# Patient Record
Sex: Male | Born: 1960 | Race: White | Hispanic: No | Marital: Married | Smoking: Never smoker
Health system: Southern US, Community
[De-identification: ages and names within clinical notes are randomized; demographics above are authoritative.]

## PROBLEM LIST (undated history)

## (undated) DIAGNOSIS — E7800 Pure hypercholesterolemia, unspecified: Secondary | ICD-10-CM

## (undated) DIAGNOSIS — Z87442 Personal history of urinary calculi: Secondary | ICD-10-CM

## (undated) DIAGNOSIS — K219 Gastro-esophageal reflux disease without esophagitis: Secondary | ICD-10-CM

## (undated) DIAGNOSIS — E119 Type 2 diabetes mellitus without complications: Secondary | ICD-10-CM

## (undated) HISTORY — PX: CARPAL TUNNEL RELEASE: SHX101

---

## 1998-10-23 HISTORY — PX: ESOPHAGOGASTRODUODENOSCOPY: SHX1529

## 2000-10-18 ENCOUNTER — Encounter: Payer: Self-pay | Admitting: Internal Medicine

## 2000-10-18 ENCOUNTER — Ambulatory Visit (HOSPITAL_COMMUNITY): Admission: RE | Admit: 2000-10-18 | Discharge: 2000-10-18 | Payer: Self-pay | Admitting: Gastroenterology

## 2001-06-03 ENCOUNTER — Encounter: Payer: Self-pay | Admitting: Gastroenterology

## 2001-06-03 ENCOUNTER — Encounter: Admission: RE | Admit: 2001-06-03 | Discharge: 2001-06-03 | Payer: Self-pay | Admitting: Gastroenterology

## 2005-03-29 ENCOUNTER — Ambulatory Visit: Payer: Self-pay | Admitting: Gastroenterology

## 2007-04-03 ENCOUNTER — Ambulatory Visit: Payer: Self-pay | Admitting: Gastroenterology

## 2007-08-24 ENCOUNTER — Inpatient Hospital Stay (HOSPITAL_COMMUNITY): Admission: EM | Admit: 2007-08-24 | Discharge: 2007-08-25 | Payer: Self-pay | Admitting: Emergency Medicine

## 2008-03-12 DIAGNOSIS — K224 Dyskinesia of esophagus: Secondary | ICD-10-CM | POA: Insufficient documentation

## 2008-03-12 DIAGNOSIS — E669 Obesity, unspecified: Secondary | ICD-10-CM | POA: Insufficient documentation

## 2008-03-12 DIAGNOSIS — K219 Gastro-esophageal reflux disease without esophagitis: Secondary | ICD-10-CM | POA: Insufficient documentation

## 2011-03-07 NOTE — H&P (Signed)
Christian Yang, Christian Yang                 ACCOUNT NO.:  [id number]   MEDICAL RECORD NO.:  [id number]          PATIENT TYPE:  EMS   LOCATION:  ED                           FACILITY:  [facility]   PHYSICIAN:  Corinna L. Lendell Caprice, MDDATE OF BIRTH:  [birth year]   DATE OF ADMISSION:  08/24/2007  DATE OF DISCHARGE:                              HISTORY & PHYSICAL   CHIEF COMPLAINT:  Polyuria, polydipsia.   HISTORY OF PRESENT ILLNESS:  Christian Yang is a pleasant 50 year old white  male patient of Dr. Marinda Elk, who was sent to the emergency room  from urgent care.  He had had polyuria, polydipsia, dry mouth for 3-4  days.  He has also been feeling weak and dizzy.  He was noted to have a  very high blood glucose at the urgent care center  and sent to the  emergency room for evaluation.  He has no previous history of diabetes.  He says he has a sore throat and apparently had a strep test done at the  urgent care center which was negative.  He has no other complaints.   PAST MEDICAL HISTORY:  Gastroesophageal reflux disease with esophagitis  and stricture, followed by [facility].   MEDICATIONS:  Prilosec.   ALLERGY:  PENICILLIN.   SOCIAL HISTORY:  The patient is married.  He does not drink, smoke or  use drugs.   FAMILY HISTORY:  His mother has diabetes.  His sister has diabetes.  His  grandmother had diabetes.  His father had coronary artery bypass graft  in his 1s.  His mother has cancer.  He believes it was breast cancer.   PAST SURGICAL HISTORY:  Bilateral carpal tunnel release.   REVIEW OF SYSTEMS:  As above, otherwise negative.   PHYSICAL EXAMINATION:  Temperature is 98.3, blood pressure 147/104,  pulse 117, respiratory rate 20, oxygen saturation 96% on room air.  GENERAL:  The patient is well-nourished, well-developed and in no acute  distress.  HEENT:  Normocephalic, atraumatic.  Pupils equal, round, reactive to  light.  Extraocular movements are intact.  Slightly dry mucous  membranes.  Oropharynx is without exudate.  He had some petechiae over  his oropharynx.  NECK:  Supple.  No lymphadenopathy.  LUNGS:  Clear to auscultation bilaterally without wheezes, rhonchi or  rales.  CARDIOVASCULAR:  Regular rate, rhythm without murmurs, gallops or rubs.  ABDOMEN:  Normal bowel sounds, soft, nontender, nondistended.  GU AND RECTAL:  Deferred.  EXTREMITIES:  No clubbing, cyanosis or edema.  SKIN:  No rash.  PSYCHIATRIC:  Normal affect.  NEUROLOGIC:  Alert and oriented.  Cranial nerves, sensorimotor exam are  intact.   LABORATORY DATA:  CBC unremarkable.  Basic metabolic panel is  significant for a glucose of 418.  His bicarbonate is 28.   ASSESSMENT AND PLAN:  1. New onset diabetes.  The patient has already received 2 bags of      intravenous fluid, and been started on a Glucommander.  I will      continue this.  He will be admitted.  I will start Amaryl and  metformin.  I will also give Lantus and do diabetes teaching.  He      will continue intravenous fluids, monitor electrolytes.  2. Gastroesophageal reflux disease.  Continue proton pump inhibitor.      Corinna L. Lendell Caprice, MD  Electronically Signed     CLS/MEDQ  D:  08/24/2007  T:  08/26/2007  Job:  [id number]   cc:   Molly Maduro L. Foy Guadalajara, M.D.  Fax: [phone]

## 2011-03-07 NOTE — Discharge Summary (Signed)
Christian Yang, Christian Yang                 ACCOUNT NO.:  [id number]   MEDICAL RECORD NO.:  [id number]          PATIENT TYPE:  INP   LOCATION:  1301                         FACILITY:  [facility]   PHYSICIAN:  Corinna L. Lendell Caprice, MDDATE OF BIRTH:  11-Aug-1961   DATE OF ADMISSION:  08/24/2007  DATE OF DISCHARGE:  08/25/2007                               DISCHARGE SUMMARY   DISCHARGE DIAGNOSES:  1. Uncontrolled type 2 diabetes, new diagnosis.  2. Gastroesophageal reflux disease.   DISCHARGE MEDICATIONS:  1. Continue Prilosec.  2. Metformin 500 mg p.o. b.i.d.  3. Amaryl 4 mg a day.   CONDITION:  Stable.   ACTIVITY:  Ad lib.   FOLLOWUP:  1. Dr. Foy Guadalajara next week, with blood glucose measurements.  2. Follow up with outpatient diabetes education classes, which I am      asking the care manager to arrange.  3. He will also follow up with ophthalmologist for diabetic eye      examination.   DIET:  Diabetic.   LABORATORIES:  Hemoglobin A1c 9.7.  CBC unremarkable.  Initial complete  metabolic panel significant for a glucose of 418, otherwise  unremarkable.   HISTORY AND HOSPITAL COURSE:  Christian Yang is a pleasant 50 year old white  male who presented to an Urgent Care Center with polyuria, polydipsia  and dry mouth.  He was found to have blood sugar which was very high.  He was therefore referred to the emergency room.  He was found to have  an elevated blood glucose, he did not have DKA.  He had no previous  history of diabetes, but a strong family history of diabetes.  He was  dehydrated on exam, please see H&P for complete admission details.   He had fairly unremarkable vital signs and otherwise fairly unremarkable  exam.  He was started on IV fluids and insulin drip.  He was  transitioned to Amaryl and metformin, and at the time of discharge his  blood glucoses were ranging in the 100s.  I have asked the nurses to do  diabetic teaching and teach him how to monitor his blood glucose.  I  have  also referred him to outpatient diabetes classes.      Corinna L. Lendell Caprice, MD  Electronically Signed     CLS/MEDQ  D:  08/25/2007  T:  08/26/2007  Job:  [id number]   cc:   Molly Maduro L. Foy Guadalajara, M.D.  Fax: [phone]

## 2011-03-07 NOTE — Assessment & Plan Note (Signed)
[facility]                         GASTROENTEROLOGY OFFICE NOTE   NAME:Uyeno, Boone A                        MRN:          [id number]  DATE:04/03/2007                            DOB:          08/09/1961    Vicent comes in on June 11, says he does have some dysphagia when drinking  something cold or eating certain foods.  He says he does well with the  Prilosec, does not seem he needs anything else.  He has taken Nexium in  the past but [facility] would not continue to pay for this so he  has been using Prilosec.  He said both of these pills give him somewhat  of a bloated feeling.  I told him he should try some different ones and  I would give him samples of these and he could let us know which one  seemed to be most effective and we could prescribe it accordingly and  especially if [facility] will pay it, it would be cheaper than  buying Prilosec over the counter.   PHYSICAL EXAMINATION:  He weighed 276, blood pressure 144/80, pulse 72  and regular.  Neck, heart and extremities are unremarkable.   IMPRESSION:  1. Gastroesophageal reflux disease that responded nicely to proton      pump inhibitor.  2. Mild obesity.   RECOMMENDATIONS:  Is as above and continue taking the medication as  needed.  I also told him to followup with Dr. Leone Payor and that he could  decide which medicines he would be best suited for financially and  symptomatically.     Ulyess Mort, MD  Electronically Signed    SML/MedQ  DD: 04/03/2007  DT: 04/04/2007  Job #: [id number]

## 2011-03-10 NOTE — Procedures (Signed)
[facility]  Patient:    Christian Yang, Christian Yang                          MRN: [id number] Attending:  Hedwig Morton. Juanda Chance, M.D. [facility] CC:         Ulyess Mort, M.D. [facility]   Procedure Report  PROCEDURE:  Upper endoscopy and esophageal dilation.  ENDOSCOPIST:  Hedwig Morton. Juanda Chance, M.D.  INDICATIONS:  This is a 50 year old patient of Dr. Ulyess Mort who was scheduled for upper endoscopy and esophageal dilation today but Dr. Corinda Gubler was unable to do the procedure.  Christian Yang has been complaining of progressive solid food dysphagia, with several episodes of regurgitation of the food, nocturnal reflux and heartburn.  He has never had previous endoscopy and dilatation.  ENDOSCOPE:  Olympus single-channel videoscope.  SEDATION:  Versed 10 mg IV, Demerol 100 mg IV.  FINDINGS:  Olympus single-channel videoscope was passed directly into the posterior pharynx and into the esophagus.  Patient was monitored by pulse oximeter; his oxygen saturation were done to 87%, but responded to increased nasal oxygen from 2 to 5 l/min.  He was very cooperative.  Proximal and mid-esophageal mucosa was unremarkable.  There were several linear erosions in the distal esophagus close to the GE junction; there was also increased fibrous tissue at the GE junction with evidence of mild esophageal stricture. Endoscope traversed into stomach without difficulty.  There were no erosions in midesophagus.  Stomach:  Stomach was insufflated with air, with normal gastric mucosa, gastric antrum and pyloric outlet.  Retroflexion of endoscope revealed normal fundus and cardia.  Duodenum:  Duodenal bulb and descending duodenum were normal.  Endoscope was then brought back into the stomach.  Guidewire was placed into the stomach and Savary dilators passed over the guidewire, using 14-, 15-, 16- and 17-mm Savary dilators.  There was no fluoroscopic guidance.  There was no blood on the dilators.  Patient  tolerated the procedure well.  IMPRESSION 1. Grade 1 reflux esophagitis. 2. Mild distal esophageal stricture, status post dilatation to 51-French.  PLAN 1. Strict antireflux measures including weight loss. 2. Continue Prevacid 30 mg p.o. b.i.d. for four to six weeks and then decrease    to 30 mg a day.  Patient will require acid-suppressing medications on a    chronic basis. 3. Follow up with Dr. Corinda Gubler in six to eight weeks. DD:  10/18/00 TD:  10/18/00 Job: [id number]

## 2011-08-01 LAB — DIFFERENTIAL
Basophils Absolute: 0
Basophils Relative: 0
Eosinophils Absolute: 0
Eosinophils Relative: 0
Lymphocytes Relative: 14
Lymphs Abs: 1.4
Monocytes Absolute: 0.6
Monocytes Relative: 6
Neutro Abs: 7.7
Neutrophils Relative %: 79 — ABNORMAL HIGH

## 2011-08-01 LAB — BASIC METABOLIC PANEL
BUN: 11
BUN: 15
BUN: 19
CO2: 25
CO2: 28
Calcium: 10.1
Calcium: 9.2
Chloride: 100
Chloride: 93 — ABNORMAL LOW
Creatinine, Ser: 0.93
Creatinine, Ser: 1.19
Creatinine, Ser: 1.47
GFR calc Af Amer: >60
GFR calc Af Amer: >60
GFR calc Af Amer: >60
GFR calc non Af Amer: 52 — ABNORMAL LOW
GFR calc non Af Amer: >60
GFR calc non Af Amer: >60
Glucose, Bld: 418 — ABNORMAL HIGH
Glucose, Bld: 678
Potassium: 3.9
Potassium: 4.4
Potassium: 4.6
Sodium: 134 — ABNORMAL LOW
Sodium: 137

## 2011-08-01 LAB — CBC
HCT: 44.4
Hemoglobin: 15.7
MCHC: 35.2
MCV: 83.3
Platelets: 283
RBC: 5.34
RDW: 12.7
WBC: 9.7

## 2011-08-01 LAB — PHOSPHORUS: Phosphorus: 3.2

## 2011-08-01 LAB — HEPATIC FUNCTION PANEL
ALT: 34
Albumin: 3.7
Total Protein: 7.1

## 2011-08-01 LAB — MAGNESIUM: Magnesium: 2.6 — ABNORMAL HIGH

## 2015-10-15 LAB — LIPID PANEL: LDL CALC: 126 mg/dL

## 2015-10-15 LAB — MICROALBUMIN, URINE: MICROALB UR: 9.9

## 2016-02-17 ENCOUNTER — Encounter (HOSPITAL_COMMUNITY): Payer: Self-pay

## 2016-02-17 ENCOUNTER — Other Ambulatory Visit (HOSPITAL_COMMUNITY): Payer: Self-pay | Admitting: Family Medicine

## 2016-02-17 ENCOUNTER — Ambulatory Visit (HOSPITAL_COMMUNITY)
Admission: RE | Admit: 2016-02-17 | Discharge: 2016-02-17 | Disposition: A | Discharge status: Home / Self Care | Payer: Managed Care, Other (non HMO) | Source: Ambulatory Visit | Attending: Family Medicine | Admitting: Family Medicine

## 2016-02-17 ENCOUNTER — Other Ambulatory Visit (HOSPITAL_COMMUNITY): Payer: Self-pay | Admitting: Internal Medicine

## 2016-02-17 DIAGNOSIS — I2609 Other pulmonary embolism with acute cor pulmonale: Secondary | ICD-10-CM

## 2016-02-17 DIAGNOSIS — R938 Abnormal findings on diagnostic imaging of other specified body structures: Secondary | ICD-10-CM | POA: Diagnosis present

## 2016-02-17 DIAGNOSIS — I2782 Chronic pulmonary embolism: Principal | ICD-10-CM

## 2016-02-17 HISTORY — DX: Type 2 diabetes mellitus without complications: E11.9

## 2016-02-17 LAB — POCT I-STAT CREATININE: CREATININE: 1 mg/dL (ref 0.61–1.24)

## 2016-02-17 MED ORDER — IOPAMIDOL (ISOVUE-370) INJECTION 76%
100.0000 mL | Freq: Once | INTRAVENOUS | Status: AC | PRN
  Administered 2016-02-17: 100 mL via INTRAVENOUS

## 2016-03-24 LAB — HEMOGLOBIN A1C: Hemoglobin A1C: 10.3

## 2016-04-07 ENCOUNTER — Encounter: Payer: Self-pay | Admitting: Internal Medicine

## 2016-05-11 ENCOUNTER — Encounter: Payer: Self-pay | Admitting: Endocrinology

## 2016-05-11 ENCOUNTER — Ambulatory Visit (INDEPENDENT_AMBULATORY_CARE_PROVIDER_SITE_OTHER): Payer: Managed Care, Other (non HMO) | Admitting: Endocrinology

## 2016-05-11 VITALS — BP 126/70 | HR 80 | Ht 72.25 in | Wt 177.0 lb

## 2016-05-11 DIAGNOSIS — E7800 Pure hypercholesterolemia, unspecified: Secondary | ICD-10-CM | POA: Diagnosis not present

## 2016-05-11 DIAGNOSIS — E1165 Type 2 diabetes mellitus with hyperglycemia: Secondary | ICD-10-CM

## 2016-05-11 DIAGNOSIS — R634 Abnormal weight loss: Secondary | ICD-10-CM

## 2016-05-11 DIAGNOSIS — Z794 Long term (current) use of insulin: Secondary | ICD-10-CM

## 2016-05-11 DIAGNOSIS — E041 Nontoxic single thyroid nodule: Secondary | ICD-10-CM | POA: Diagnosis not present

## 2016-05-11 NOTE — Patient Instructions (Addendum)
Check blood sugars on waking up  4-5  times a week Also check blood sugars about 2 hours after a meal and do this after different meals by rotation  Recommended blood sugar levels on waking up is 90-130 and about 2 hours after meal is 130-160  Please bring your blood sugar monitor to each visit, thank you  Levemir 20 U 2x daily

## 2016-05-11 NOTE — Progress Notes (Signed)
Patient ID: Christian Yang, male   DOB: [birth year], 55 y.o.   MRN: [id number]           Reason for Appointment: Consultation for Type 2 Diabetes  Referring physician: Lenise Arena   History of Present Illness:          Date of diagnosis of type 2 diabetes mellitus:   2008       Background history:   He apparently had a weight of 285 pounds at diagnosis He was having dry mouth and flulike symptoms at onset and glucose was 672 He was started on metformin is still taking Not clear what other medications he has been taking but he has been on Jardiance for 2 years and Bydureon since 6/17 Records indicated that his A1c has been 10-11+ since at least 2015 Levemir insulin was started 3-4 years ago  Recent history:   INSULIN regimen is:  Levemir 40 units at bedtime       Non-insulin hypoglycemic drugs the patient is taking are: Jardiance 10 mg daily, metformin 1 g twice a day, Bydureon 2 mg weekly  Current management, blood sugar patterns and problems identified:  He has been on the same dose of Levemir for some time  His fasting blood sugars are usually normal to low with some variability.  Blood sugars reviewed from home record  Blood sugars later in the day are consistently high, recently as high as 495    He has been taking Bydureon since last month and has taken it for over 6 weeks without any improvement in his blood sugars  He is continuing to lose weight and has lost 10-12 pounds this year without trying  He tends to get tired but partly from his long work hours  Does not have a formal exercise program  Generally his diet is low in fat and portions         Side effects from medications have been: None  Compliance with the medical regimen:  Hypoglycemia:   occurring about once or twice a week in the mornings, as early as 4 AM  Glucose monitoring:  done 1-3  times a day         Glucometer:    Accu-Chek    Blood Glucose readings by time of day reviewed from home  record   PREMEAL Breakfast Lunch Dinner Bedtime  Overall   Glucose range: 49-177   129-278  178-495    Median:         Self-care: The diet that the patient has been following is: tries to limit High-fat foods .     Typical meal intake: Breakfast is cereal, lunch is a sandwich and fruit.  Dinner is baked chicken, salad and rice.  Snacks are on unit butter and crackers               Dietician visit, most recent: At diagnosis only               Exercise: On his feet at work.  Occasionally walking on his days off   Weight history: Maximum 285  Wt Readings from Last 3 Encounters:  05/11/16 177 lb (80.287 kg)    Glycemic control:   Lab Results  Component Value Date   HGBA1C 10.3 03/24/2016   HGBA1C * 08/24/2007    9.7 (NOTE)   The ADA recommends the following therapeutic goals for glycemic   control related to Hgb A1C measurement:   Goal of Therapy:   < 7.0% Hgb  A1C   Action Suggested:  > 8.0% Hgb A1C   Ref:  Diabetes Care, 22, Suppl. 1, 1999   Lab Results  Component Value Date   MICROALBUR 9.9 10/15/2015   LDLCALC 126 10/15/2015   CREATININE 1.00 02/17/2016   No results found for: MICRALBCREAT       Medication List       This list is accurate as of: 05/11/16  3:49 PM.  Always use your most recent med list.               ACCU-CHEK AVIVA device  by Other route. Use as instructed     aspirin 81 MG tablet  Take 81 mg by mouth daily.     BD PEN NEEDLE NANO U/F 32G X 4 MM Misc  Generic drug:  Insulin Pen Needle  U UTD ONCE A DAY     BYDUREON 2 MG Pen  Generic drug:  Exenatide ER  INJ [place] ONCE A WK  UTD     JARDIANCE 10 MG Tabs tablet  Generic drug:  empagliflozin  TK 1 T PO D     LEVEMIR 100 UNIT/ML injection  Generic drug:  insulin detemir  Inject 40 Units into the skin at bedtime. PEN     metFORMIN 1000 MG tablet  Commonly known as:  GLUCOPHAGE  Take 1,000 mg by mouth 2 (two) times daily with a meal.     omeprazole 40 MG capsule  Commonly known as:   PRILOSEC  Take 40 mg by mouth daily.        Allergies:  Allergies  Allergen Reactions  . Penicillins Rash    Past Medical History  Diagnosis Date  . Diabetes mellitus without complication (HCC)     No past surgical history on file.  Family History  Problem Relation Age of Onset  . Heart disease Mother   . Diabetes Mother   . Diabetes Sister   . Diabetes Maternal Grandmother     Social History:  reports that he has never smoked. He does not have any smokeless tobacco history on file. He reports that he does not drink alcohol. His drug history is not on file.   Review of Systems  Constitutional: Negative for reduced appetite.  HENT: Negative for trouble swallowing.   Eyes: Negative for blurred vision.  Respiratory: Negative for shortness of breath.   Cardiovascular: Negative for chest pain and leg swelling.  Endocrine: Positive for fatigue and polydipsia. Negative for decreased libido and erectile dysfunction.  Genitourinary: Positive for nocturia.       Occasionally one time  Musculoskeletal: Negative for joint pain and back pain.  Skin: Negative for itching.  Neurological: Negative for numbness and tingling.     Lipid history:     Lab Results  Component Value Date   LDLCALC 126 10/15/2015           Hypertension:  Most recent eye exam was   Most recent foot exam:    LABS:  Office Visit on 05/11/2016  Component Date Value Ref Range Status  . Microalb, Ur 10/15/2015 9.9   Final  . LDL Cholesterol 10/15/2015 126   Final  . Hemoglobin A1C 03/24/2016 10.3   Final    Physical Examination:  BP 126/70 mmHg  Pulse 80  Ht 6' 0.25" (1.835 m)  Wt 177 lb (80.287 kg)  BMI 23.84 kg/m2  SpO2 97%  GENERAL:         Patient has generalized obesity.   HEENT:  Eye exam shows normal external appearance. Fundus exam shows no retinopathy. Oral exam shows normal mucosa .  NECK:   There is no lymphadenopathy 2 cm relatively firm nodule felt in the right  medial lobe.  No nodule on the left Carotids are normal to palpation and no bruit heard LUNGS:         Chest is symmetrical and barrel-shaped. Lungs are clear to auscultation.Marland Kitchen   HEART:         Heart sounds:  S1 and S2 are normal. No murmur or click heard., no S3 or S4.   ABDOMEN:   There is no distention present. Liver and spleen are not palpable. No other mass or tenderness present.   NEUROLOGICAL:   Ankle jerks are 1+ bilaterally.    Diabetic Foot Exam - Simple   Simple Foot Form  Diabetic Foot exam was performed with the following findings:  Yes 05/11/2016  2:46 PM  Visual Inspection  No deformities, no ulcerations, no other skin breakdown bilaterally:  Yes  Sensation Testing  Intact to touch and monofilament testing bilaterally:  Yes  Pulse Check  Posterior Tibialis and Dorsalis pulse intact bilaterally:  Yes  Comments             Vibration sense is moderately  reduced in distal first toes. MUSCULOSKELETAL:  There is no swelling or deformity of the peripheral joints. Spine is normal to inspection.   EXTREMITIES:     There is no edema. No skin lesions present.Marland Kitchen SKIN:       No rash or lesions of concern.        ASSESSMENT:  Diabetes type 2, uncontrolled     He has had long-standing diabetes and has had poor control at least for 2 years with A1c consistently over 10% Currently on a regimen of Levemir at night, Bydureon, Jardiance and metformin Has marked post prandial hyperglycemia With Levemir at night he is having normal to low sugars in the mornings getting excessive doses of insulin overnight His lifestyle is fairly good with usually healthy diet although can be more consistent with protein at breakfast and probably moderation of carbohydrates  He may be symptomatic from his hyperglycemia with continued weight loss, some fatigue and increased thirst He is BMI currently is only 24 and is clearly insulin deficient and not benefiting from multiple non-insulin  drugs  Complications of diabetes: None evident  HYPERCHOLESTEROLEMIA: Currently not on treatment.  Has family history of CAD and needs to be on a statin drug. Although he is reportedly intolerant to Lipitor he has not tried any other statins  THYROID nodule: He has a thyroid nodule on the right side and this will need to be evaluated with ultrasound at some point  PLAN for diabetes:    Needs to be on a basal bolus insulin regimen  He was shown the V-go pump as he is somewhat reluctant to take multiple mealtime insulin injections.  Discussed how this is ineffective and glucose control, convenience of doing mealtime boluses with this and overall better controlled with lower insulin doses.  He is interested in this as long as he has no excessive out-of-pocket expense.  We will verify his insurance benefit and schedule him to start this with nurse educator in 10 days  Most likely he can start with 20 unit basal insulin pump with boluses of 6-8 units at meals, higher doses at suppertime  Meanwhile split insulin doses to 20 units twice a day to reduce morning hypoglycemia and daytime  hyperglycemia  May stop Jardiance and Bydureon, continue metformin  Bring blood sugar monitor for download on each visit and check more readings after breakfast and lunch  Consider consultation with dietitian also   Patient Instructions  Check blood sugars on waking up  4-5  times a week Also check blood sugars about 2 hours after a meal and do this after different meals by rotation  Recommended blood sugar levels on waking up is 90-130 and about 2 hours after meal is 130-160  Please bring your blood sugar monitor to each visit, thank you  Levemir 20 U 2x daily   Counseling time on subjects discussed above is over 50% of today's 60 minute visit  Christian Yang 05/11/2016, 3:49 PM   Note: This office note was prepared with Dragon voice recognition system technology. Any transcriptional errors that result  from this process are unintentional.

## 2016-05-17 ENCOUNTER — Ambulatory Visit
Admission: RE | Admit: 2016-05-17 | Discharge: 2016-05-17 | Disposition: A | Discharge status: Home / Self Care | Payer: Managed Care, Other (non HMO) | Source: Ambulatory Visit | Attending: Endocrinology | Admitting: Endocrinology

## 2016-05-17 ENCOUNTER — Encounter: Payer: Managed Care, Other (non HMO) | Attending: Endocrinology | Admitting: Nutrition

## 2016-05-17 DIAGNOSIS — E1165 Type 2 diabetes mellitus with hyperglycemia: Secondary | ICD-10-CM | POA: Insufficient documentation

## 2016-05-17 DIAGNOSIS — E119 Type 2 diabetes mellitus without complications: Secondary | ICD-10-CM

## 2016-05-17 DIAGNOSIS — Z713 Dietary counseling and surveillance: Secondary | ICD-10-CM | POA: Insufficient documentation

## 2016-05-17 DIAGNOSIS — Z794 Long term (current) use of insulin: Secondary | ICD-10-CM | POA: Diagnosis not present

## 2016-05-17 DIAGNOSIS — E041 Nontoxic single thyroid nodule: Secondary | ICD-10-CM

## 2016-05-19 ENCOUNTER — Telehealth: Payer: Self-pay | Admitting: Endocrinology

## 2016-05-19 ENCOUNTER — Telehealth: Payer: Self-pay

## 2016-05-19 NOTE — Telephone Encounter (Signed)
Patient called wanting lab results; no results for blood work in yet; advised of ultrasound results.

## 2016-05-19 NOTE — Telephone Encounter (Signed)
PT looking to get his test results.

## 2016-05-23 DIAGNOSIS — E119 Type 2 diabetes mellitus without complications: Secondary | ICD-10-CM | POA: Insufficient documentation

## 2016-05-23 NOTE — Patient Instructions (Signed)
Fill and apply a new V-go 20every day at the same time Give 3 button presses for smaller meals, and 4 button presses before larger meals. Stop the Levemir insulin Test blood sugars before meals and at bedtime and bring readings to Dr. [name] next week Call if blood sugars drop low or stay over 250.

## 2016-05-23 NOTE — Assessment & Plan Note (Signed)
Pt was instructed on how to fill, apply and use the V-g0 20.  He filled a v-Go with Novolog insulin.  He did not apply it, because he to his Levemir last night at [facility].  He will apply it tonight at [facility].  He was shown how to do this.  He was given a V-go 20 starter kit with directions for use on how to fill, apply and use the V-go.  He was also given an 800 telephone number to call if questions. It was stressed that he will no longer take his Levemir insulin.  Written instructions were given for this. Written instructions were also given for taking 3-4 button presses before each meal. Three button press for smaller meals, and 4 button presses for larger meals.  He reported good understanding of this and re demonstrated how to do the button pressed correctly.   He was also instructed to test his  Blood sugar before meals, and at bedtime.  He was given a sheet to record the blood sugar readings.  He was instructed to call the office if his readings drop low, or go over 250.  He reported good understanding of this and had no final questions.

## 2016-05-25 ENCOUNTER — Encounter: Payer: Self-pay | Admitting: Endocrinology

## 2016-05-25 ENCOUNTER — Ambulatory Visit (INDEPENDENT_AMBULATORY_CARE_PROVIDER_SITE_OTHER): Payer: Managed Care, Other (non HMO) | Admitting: Endocrinology

## 2016-05-25 VITALS — BP 124/70 | HR 76 | Ht 72.0 in | Wt 185.0 lb

## 2016-05-25 DIAGNOSIS — E1165 Type 2 diabetes mellitus with hyperglycemia: Secondary | ICD-10-CM | POA: Diagnosis not present

## 2016-05-25 DIAGNOSIS — Z794 Long term (current) use of insulin: Secondary | ICD-10-CM | POA: Diagnosis not present

## 2016-05-25 DIAGNOSIS — E785 Hyperlipidemia, unspecified: Secondary | ICD-10-CM

## 2016-05-25 MED ORDER — ONETOUCH DELICA LANCETS 33G MISC: 2 refills | Status: AC

## 2016-05-25 MED ORDER — GLUCOSE BLOOD VI STRP: ORAL_STRIP | 12 refills | Status: DC

## 2016-05-25 MED ORDER — V-GO 20 KIT: PACK | 2 refills | Status: DC

## 2016-05-25 MED ORDER — ONETOUCH VERIO FLEX SYSTEM W/DEVICE KIT: 1.0000 | PACK | Freq: Three times a day (TID) | 2 refills | Status: DC

## 2016-05-25 NOTE — Patient Instructions (Signed)
2 click at lunch on weekdays, 3-4 on weekends  3-4 clicks for all meals on weekends

## 2016-05-25 NOTE — Progress Notes (Signed)
Patient ID: Christian Yang, male   DOB: [birth year], 55 y.o.   MRN: [id number]           Reason for Appointment: Follow-up for Type 2 Diabetes  Referring physician: Olen Pel   History of Present Illness:          Date of diagnosis of type 2 diabetes mellitus:   2008       Background history:   He apparently had a weight of 285 pounds at diagnosis He was having dry mouth and flulike symptoms at onset and glucose was 672 He was started on metformin is still taking Not clear what other medications he has been taking but he has been on Jardiance for 2 years and Bydureon since 6/17 Records indicated that his A1c has been 10-11+ since at least 2015 Levemir insulin was started 3-4 years ago  Recent history:   INSULIN regimen is:  V-go pump 20.  Boluses usually 6 units at meals        Non-insulin hypoglycemic drugs the patient is taking are: None, previously was on Jardiance 10 mg daily, metformin 1 g twice a day, Bydureon 2 mg weekly  Current management, blood sugar patterns and problems identified:  He has had marked hyperglycemia with last A1c 10.3 on multiple non-insulin drugs along with Levemir insulin once a day 40 units  He was started on the V-go pump about a week ago using the 20 unit basal  Previously his blood sugars were variable in the morning but usually significantly higher in the evenings  More recently his fasting blood sugars have been relatively better although somewhat inconsistent  Blood sugars have been during the week near normal at lunchtime, relatively low at suppertime and variable after supper  He tends to have higher readings on the weekend since he is not as active  Also occasionally has eye reading after evening meal during the weekday  He has gained some weight back which she had lost before  He subjectively feels better  Generally his diet is low in fat and portions         Side effects from medications have been: None  Compliance with the  medical regimen:  Hypoglycemia:   occurring about once or twice a week in the mornings, as early as 4 AM  Glucose monitoring:  done 1-3  times a day         Glucometer:    Accu-Chek    Blood Glucose readings by time of day reviewed from home record since starting the pump  Mean values apply above for all meters except median for One Touch  PRE-MEAL Fasting Lunch Dinner Bedtime Overall  Glucose range: 112-184  93-186  46-256  77-224    Mean/median:         Self-care: The diet that the patient has been following is: tries to limit High-fat foods .     Typical meal intake: Breakfast is cereal, lunch is a sandwich and fruit.  Dinner is baked chicken, salad and rice.  Snacks are on unit butter and crackers               Dietician visit, most recent: At diagnosis only CDE visit: 7/17               Exercise: On his feet at work.  Occasionally walking on his days off   Weight history: Maximum 285  Wt Readings from Last 3 Encounters:  05/25/16 185 lb (83.9 kg)  05/11/16 177 lb (  80.3 kg)    Glycemic control:   Lab Results  Component Value Date   HGBA1C 10.3 03/24/2016   HGBA1C (H) 08/24/2007    9.7 (NOTE)   The ADA recommends the following therapeutic goals for glycemic   control related to Hgb A1C measurement:   Goal of Therapy:   < 7.0% Hgb A1C   Action Suggested:  > 8.0% Hgb A1C   Ref:  Diabetes Care, 22, Suppl. 1, 1999   Lab Results  Component Value Date   MICROALBUR 9.9 10/15/2015   [place] 126 10/15/2015   CREATININE 1.00 02/17/2016   No results found for: [facility]       Medication List       Accurate as of 05/25/16 11:59 PM. Always use your most recent med list.          ACCU-CHEK AVIVA device by Other route. Use as instructed   ONETOUCH VERIO FLEX SYSTEM w/Device Kit 1 each by Does not apply route 3 (three) times daily.   aspirin 81 MG tablet Take 81 mg by mouth daily.   BD PEN NEEDLE NANO U/F 32G X 4 MM Misc Generic drug:  Insulin Pen Needle U UTD  ONCE A DAY   BYDUREON 2 MG Pen Generic drug:  Exenatide ER INJ [place] ONCE A WK  UTD   glucose blood test strip Commonly known as:  ONETOUCH VERIO Use to check blood sugar 3 times per day.   insulin aspart 100 UNIT/ML injection Commonly known as:  novoLOG Inject into the skin 3 (three) times daily before meals. Per v-go pump.   metFORMIN 1000 MG tablet Commonly known as:  GLUCOPHAGE Take 1,000 mg by mouth 2 (two) times daily with a meal.   omeprazole 40 MG capsule Commonly known as:  PRILOSEC Take 40 mg by mouth daily.   ONETOUCH DELICA LANCETS 51W Misc Use to check blood sugar 3 times per day.   V-GO 20 Kit Use 1 per day.       Allergies:  Allergies  Allergen Reactions  . Penicillins Rash    Past Medical History:  Diagnosis Date  . Diabetes mellitus without complication ([place])     No past surgical history on file.  Family History  Problem Relation Age of Onset  . Heart disease Mother   . Diabetes Mother   . Diabetes Sister   . Diabetes Maternal Grandmother     Social History:  reports that he has never smoked. He does not have any smokeless tobacco history on file. He reports that he does not drink alcohol. His drug history is not on file.   Review of Systems   Lipid history: Not on Rx    Lab Results  Component Value Date   [place] 126 10/15/2015           Hypertension:None  Most recent eye exam was 6/17  Most recent foot exam: 7/17  On his initial exam he was felt to have a thyroid nodule but his ultrasound was normal    LABS:  No visits with results within 1 Week(s) from this visit.  Latest known visit with results is:  Office Visit on 05/11/2016  Component Date Value Ref Range Status  . Microalb, Ur 10/15/2015 9.9   Final  . LDL Cholesterol 10/15/2015 126  mg/dL Final  . Hemoglobin A1C 03/24/2016 10.3   Final    Physical Examination:  BP 124/70 (BP Location: Left Arm, Patient Position: Sitting, Cuff Size: Normal)   Pulse 76  Ht 6' (1.829 m)   Wt 185 lb (83.9 kg)   SpO2 97%   BMI 25.09 kg/m     ASSESSMENT:  Diabetes type 2, uncontrolled    See history of present illness for detailed discussion of current diabetes management, blood sugar patterns and problems identified  He has had long-standing diabetes and has had poor control at least for 2 years with A1c consistently over 10%  Now with starting the V-go pump and using only 20 units basal without any non-insulin hypoglycemic drugs his blood sugars are significantly better and more consistent He has subjectively felt better and has gained back some of this weight loss He did not continue metformin as directed  With current bolus regimen of mostly 6 units per meal is having good control on the weekdays at lunchtime but tendency to low blood sugars before suppertime Blood sugars after supper have been somewhat variable probably based on his diet FASTING blood sugars are near normal about 4 of the mornings he has tested and high on 2 mornings Blood sugars are higher on the weekend when he is less active  HYPERCHOLESTEROLEMIA: Currently not on treatment.  Again discussed possibility of using A statin suggest pravastatin, he has Has family history of CAD    PLAN for diabetes:    Needs to continue the 20 unit basal  Since he is getting low at suppertime on weekdays he can reduce his bolus by 2 units at lunch  May need to use 4 clicks for his meals on weekends and 4 clicks in the evening at dinner if eating a larger meals are more carbohydrate  Discussed adjusting mealtime doses based on food and activity level, and may need to count carbohydrates  He will bring his monitor for download on the next visit May try Novolog for his pump, not clear if this is covered by his insurance  Restart metformin as this will help fasting hyperglycemia more consistently  Consider consultation with dietitian also Recheck lipids on the next visit   Patient  Instructions  2 click at lunch on weekdays, 3-4 on weekends  3-4 clicks for all meals on weekends  Counseling time on subjects discussed above is over 50% of today's 25 minute visit  Alyrica Thurow 05/26/2016, 7:53 AM   Note: This office note was prepared with Dragon voice recognition system technology. Any transcriptional errors that result from this process are unintentional.

## 2016-05-26 ENCOUNTER — Telehealth: Payer: Self-pay | Admitting: Endocrinology

## 2016-05-26 NOTE — Telephone Encounter (Signed)
I contacted the pt's wife and advised we do have a sample the pt could come by and pick up for the v-go 20. She voiced understanding and stated she would come by to pick the rx up.

## 2016-05-26 NOTE — Telephone Encounter (Signed)
Rx submitted along with supplies.

## 2016-05-26 NOTE — Telephone Encounter (Signed)
Pt wife called and said she does not thing the pharmacy will get the VGo in for this weekend and wants to know if PT can have enough until Monday

## 2016-05-26 NOTE — Telephone Encounter (Signed)
Patient need a new meter called in to one touch ultra [facility] - [place], [place] - [address] [phone] (Phone) [phone] (Fax)

## 2016-05-29 ENCOUNTER — Other Ambulatory Visit: Payer: Self-pay

## 2016-05-29 NOTE — Telephone Encounter (Signed)
Patient need medication insulin aspart (NOVOLOG) 100 UNIT/ML injection.sent to pharmacy to go with the vgo pump kit.insulin aspart (NOVOLOG) 100 UNIT/ML injection

## 2016-05-29 NOTE — Telephone Encounter (Signed)
RX request for the novolog was already sent into pharmacy on 05/25/16

## 2016-05-30 MED ORDER — INSULIN ASPART 100 UNIT/ML ~~LOC~~ SOLN: SUBCUTANEOUS | 2 refills | Status: DC

## 2016-05-30 NOTE — Telephone Encounter (Signed)
Rx for novolog resubmitted.

## 2016-05-30 NOTE — Telephone Encounter (Signed)
Pt needs novolog called into [place] [facility] please

## 2016-06-22 ENCOUNTER — Encounter: Payer: Self-pay | Admitting: Endocrinology

## 2016-06-22 ENCOUNTER — Ambulatory Visit (INDEPENDENT_AMBULATORY_CARE_PROVIDER_SITE_OTHER): Payer: Managed Care, Other (non HMO) | Admitting: Endocrinology

## 2016-06-22 VITALS — BP 130/82 | HR 72 | Ht 72.0 in | Wt 191.0 lb

## 2016-06-22 DIAGNOSIS — Z794 Long term (current) use of insulin: Secondary | ICD-10-CM

## 2016-06-22 DIAGNOSIS — E782 Mixed hyperlipidemia: Secondary | ICD-10-CM

## 2016-06-22 DIAGNOSIS — E1165 Type 2 diabetes mellitus with hyperglycemia: Secondary | ICD-10-CM

## 2016-06-22 NOTE — Progress Notes (Signed)
Patient ID: Christian Yang, male   DOB: Mar 14, 1961, 55 y.o.   MRN: [id number]           Reason for Appointment: Follow-up for Type 2 Diabetes  Referring physician: Olen Pel   History of Present Illness:          Date of diagnosis of type 2 diabetes mellitus:   2008       Background history:   He apparently had a weight of 285 pounds at diagnosis He was having dry mouth and flulike symptoms at onset and glucose was 672 He was started on metformin is still taking Not clear what other medications he has been taking but he has been on Jardiance for 2 years and Bydureon since 6/17 Records indicated that his A1c has been 10-11+ since at least 2015 Levemir insulin was started 3-4 years ago  Recent history:   INSULIN regimen is:  V-go pump 20.  Boluses usually 4-4-4 units at meals        Non-insulin hypoglycemic drugs the patient is taking are: metformin 1 g twice a day  He had marked hyperglycemia with last A1c 10.3 on multiple non-insulin drugs along with Levemir insulin once a day 40 units  Current management, blood sugar patterns and problems identified:   He was started on the V-go pump in 7/16  His blood sugars are overall excellent with median 112  However he is still having sporadic hypoglycemia although most of the readings are in the 60s when he has low sugars  He did have more significant hypoglycemia this past weekend both Saturday and Sunday with sugars as low as 33.  This may have been related to his being more active on Saturday but blood sugar was low after lunch on Sunday.  He thinks he did not change his diet.  Overall requiring somewhat less insulin for his meals, now 4 units and previously 6 units.  He has not had any nocturnal hypoglycemia and only one low reading waking up on Sunday morning  Previously fasting readings are relatively higher and they are better with starting metformin  However he is gaining weight.  He is generally somewhat active during his  work during the day  His breakfast is still not continuing much protein.  He will ask sporadic readings in the 200 range, probably from extra snacks.  He takes 2 units bolus for ice cream snack at bedtime  His lowest sugars are before supper and has minimal increase his blood sugars in the afternoon after lunch        Side effects from medications have been: None  Compliance with the medical regimen:  Hypoglycemia:   as above  Glucose monitoring:  done 1-3  times a day         Glucometer:    Accu-Chek    Blood Glucose readings by time of day reviewed from home record    Mean values apply above for all meters except median for One Touch  PRE-MEAL Fasting Lunch Dinner Bedtime Overall  Glucose range: 46-08  57-252  58-237  47-205    Mean/median: 103  116  80  135  112   Self-care: The diet that the patient has been following is: tries to limit High-fat foods .     Typical meal intake: Breakfast is cereal, lunch is a sandwich and fruit. Lunch 1 pm Dinner is baked chicken, salad and rice.  Snacks are on Peanut butter and crackers, having this at 10. 30 a.m. at  work                Dietician visit, most recent: At diagnosis only CDE visit: 7/17               Exercise: On his feet at work.  Occasionally walking on his days off   Weight history: Maximum 285  Wt Readings from Last 3 Encounters:  06/22/16 191 lb (86.6 kg)  05/25/16 185 lb (83.9 kg)  05/11/16 177 lb (80.3 kg)    Glycemic control:   Lab Results  Component Value Date   HGBA1C 10.3 03/24/2016   HGBA1C (H) 08/24/2007    9.7 (NOTE)   The ADA recommends the following therapeutic goals for glycemic   control related to Hgb A1C measurement:   Goal of Therapy:   < 7.0% Hgb A1C   Action Suggested:  > 8.0% Hgb A1C   Ref:  Diabetes Care, 22, Suppl. 1, 1999   Lab Results  Component Value Date   MICROALBUR 9.9 10/15/2015   [place] 126 10/15/2015   CREATININE 1.00 02/17/2016   No results found for:  MICRALBCREAT       Medication List       Accurate as of 06/22/16  9:28 PM. Always use your most recent med list.          ACCU-CHEK AVIVA device by Other route. Use as instructed   ONETOUCH VERIO FLEX SYSTEM w/Device Kit 1 each by Does not apply route 3 (three) times daily.   aspirin 81 MG tablet Take 81 mg by mouth daily.   glucose blood test strip Commonly known as:  ONETOUCH VERIO Use to check blood sugar 3 times per day.   insulin aspart 100 UNIT/ML injection Commonly known as:  novoLOG 20 units per v-go pump.   metFORMIN 1000 MG tablet Commonly known as:  GLUCOPHAGE Take 1,000 mg by mouth 2 (two) times daily with a meal.   omeprazole 40 MG capsule Commonly known as:  PRILOSEC Take 40 mg by mouth daily.   ONETOUCH DELICA LANCETS 33G Misc Use to check blood sugar 3 times per day.   V-GO 20 Kit Use 1 per day.       Allergies:  Allergies  Allergen Reactions  . Penicillins Rash    Past Medical History:  Diagnosis Date  . Diabetes mellitus without complication ([facility])     No past surgical history on file.  Family History  Problem Relation Age of Onset  . Heart disease Mother   . Diabetes Mother   . Diabetes Sister   . Diabetes Maternal Grandmother     Social History:  reports that he has never smoked. He does not have any smokeless tobacco history on file. He reports that he does not drink alcohol. His drug history is not on file.   Review of Systems   Lipid history: Not on Rx    Lab Results  Component Value Date   LDLCALC 126 10/15/2015           Hypertension:None  Most recent eye exam was 6/17  Most recent foot exam: 7/17  On his initial exam he was felt to have a thyroid nodule but his ultrasound was normal    LABS:  No visits with results within 1 Week(s) from this visit.  Latest known visit with results is:  Office Visit on 05/11/2016  Component Date Value Ref Range Status  . Microalb, Ur 10/15/2015 9.9   Final  .  LDL Cholesterol 10/15/2015 126    mg/dL Final  . Hemoglobin A1C 03/24/2016 10.3   Final    Physical Examination:  BP 130/82 (BP Location: Right Arm, Patient Position: Sitting, Cuff Size: Normal)   Pulse 72   Ht 6' (1.829 m)   Wt 191 lb (86.6 kg)   SpO2 98%   BMI 25.90 kg/m     ASSESSMENT:  Diabetes type 2, uncontrolled    See history of present illness for detailed discussion of current diabetes management, blood sugar patterns and problems identified  He has had long-standing diabetes and has had poor control at least for 2 years with A1c consistently over 10%  Now with starting the V-go pump and using only 20 units basal without any non-insulin hypoglycemic drugs his blood sugars are significantly better  As discussed above his blood sugars are not high after meals and with even 4 units of bolus insulin He is tending to have low normal or low sugars in the afternoons and before supper and occasionally at lunchtime Most significant low sugars were this past weekend possibly from increased activity not matched with increased food intake He is comfortable using the pump and likes the convenience of this and better control. However is gaining weight.   PLAN for diabetes:    Need to continue the 20 unit basal  He will reduce his bolus coverage down to 2 units for now and 4 units for larger meals or when he is less active.  May not need bolus for his bedtime snack.  Consistent protein at breakfast.  He will call if he is having excessive hypoglycemia  Continue metformin  He can reduce frequency of monitoring at breakfast time   Discussed blood sugar targets and prevention of hypoglycemia  Recheck lipids on the next visit   Patient Instructions  1 click at lunch and supper  Counseling time on subjects discussed above is over 50% of today's 25 minute visit  KUMAR,AJAY 06/22/2016, 9:28 PM   Note: This office note was prepared with Dragon voice recognition system  technology. Any transcriptional errors that result from this process are unintentional.  

## 2016-06-22 NOTE — Patient Instructions (Signed)
1 click at lunch and supper

## 2016-07-06 ENCOUNTER — Other Ambulatory Visit: Payer: Self-pay | Admitting: Endocrinology

## 2016-07-08 ENCOUNTER — Other Ambulatory Visit: Payer: Self-pay | Admitting: Endocrinology

## 2016-07-10 ENCOUNTER — Other Ambulatory Visit: Payer: Self-pay | Admitting: *Deleted

## 2016-07-10 ENCOUNTER — Telehealth: Payer: Self-pay | Admitting: Endocrinology

## 2016-07-10 MED ORDER — INSULIN ASPART 100 UNIT/ML ~~LOC~~ SOLN: SUBCUTANEOUS | 2 refills | Status: DC

## 2016-07-10 NOTE — Telephone Encounter (Signed)
[facility] called, they have questions regarding the Novolog script, they said that PT is saying he is needing 2 vials to get through the month, they need to verify.  Call back # [phone]

## 2016-07-10 NOTE — Telephone Encounter (Signed)
Rx resent for 2 Vials.

## 2016-07-21 ENCOUNTER — Other Ambulatory Visit (INDEPENDENT_AMBULATORY_CARE_PROVIDER_SITE_OTHER): Payer: Managed Care, Other (non HMO)

## 2016-07-21 DIAGNOSIS — E785 Hyperlipidemia, unspecified: Secondary | ICD-10-CM

## 2016-07-21 DIAGNOSIS — Z794 Long term (current) use of insulin: Secondary | ICD-10-CM

## 2016-07-21 DIAGNOSIS — E1165 Type 2 diabetes mellitus with hyperglycemia: Secondary | ICD-10-CM | POA: Diagnosis not present

## 2016-07-21 DIAGNOSIS — R634 Abnormal weight loss: Secondary | ICD-10-CM

## 2016-07-21 LAB — LIPID PANEL
Cholesterol: 154 mg/dL (ref 0–200)
HDL: 63.7 mg/dL (ref >39.00)
LDL Cholesterol: 81 mg/dL (ref 0–99)
NonHDL: 90
TRIGLYCERIDES: 45 mg/dL (ref 0.0–149.0)
Total CHOL/HDL Ratio: 2
VLDL: 9 mg/dL (ref 0.0–40.0)

## 2016-07-21 LAB — BASIC METABOLIC PANEL
BUN: 13 mg/dL (ref 6–23)
CHLORIDE: 103 meq/L (ref 96–112)
CO2: 32 mEq/L (ref 19–32)
Calcium: 9 mg/dL (ref 8.4–10.5)
Creatinine, Ser: 1 mg/dL (ref 0.40–1.50)
GFR: 83.04 mL/min (ref >60.00)
GLUCOSE: 53 mg/dL — AB (ref 70–99)
POTASSIUM: 3.7 meq/L (ref 3.5–5.1)
SODIUM: 140 meq/L (ref 135–145)

## 2016-07-21 LAB — TSH: TSH: 0.76 u[IU]/mL (ref 0.35–4.50)

## 2016-07-25 ENCOUNTER — Ambulatory Visit (INDEPENDENT_AMBULATORY_CARE_PROVIDER_SITE_OTHER): Payer: Managed Care, Other (non HMO) | Admitting: Endocrinology

## 2016-07-25 ENCOUNTER — Encounter: Payer: Self-pay | Admitting: Endocrinology

## 2016-07-25 VITALS — BP 142/82 | HR 68 | Ht 73.0 in | Wt 197.0 lb

## 2016-07-25 DIAGNOSIS — E1165 Type 2 diabetes mellitus with hyperglycemia: Secondary | ICD-10-CM | POA: Diagnosis not present

## 2016-07-25 DIAGNOSIS — Z794 Long term (current) use of insulin: Secondary | ICD-10-CM | POA: Diagnosis not present

## 2016-07-25 LAB — POCT GLYCOSYLATED HEMOGLOBIN (HGB A1C): HEMOGLOBIN A1C: 7.5

## 2016-07-25 NOTE — Progress Notes (Signed)
Patient ID: Christian Yang, male   DOB: 07/14/1961, 55 y.o.   MRN: [id number]           Reason for Appointment: Follow-up for Type 2 Diabetes  Referring physician: Olen Pel   History of Present Illness:          Date of diagnosis of type 2 diabetes mellitus:   2008       Background history:   He apparently had a weight of 285 pounds at diagnosis He was having dry mouth and flulike symptoms at onset and glucose was 672 He was started on metformin is still taking Not clear what other medications he has been taking but he has been on Jardiance for 2 years and Bydureon since 6/17 Records indicated that his A1c has been 10-11+ since at least 2015 Levemir insulin was started 3-4 years ago  Recent history:   INSULIN regimen is:  V-go pump 20.  Boluses usually 4-4-4 units at meals        Non-insulin hypoglycemic drugs the patient is taking are: metformin 1 g twice a day  He had marked hyperglycemia with last A1c 10.3 on multiple non-insulin drugs along with Levemir insulin once a day 40 units He was started on the V-go pump in 7/16 A1c is now down to 7.5  Current management, blood sugar patterns and problems identified:   Overall blood sugars are relatively higher than before  Although he has some variability in his sugars in the mornings they are overall fairly well controlled without hypoglycemia, only has couple of readings in the 60s in the mornings  Blood sugars are generally stable at lunch but sporadically higher on weekends  He has the lowest readings overall BEFORE suppertime with recently about 3 low readings  Blood sugars are mostly higher after supper with only one or 2 good readings below 200  HYPOGLYCEMIA is significantly less than before  BOLUSES: He was told to reduce his boluses to only 2 units and only use 4 units for larger meals he has continued the same boluses at all meals.  He does not adjust his mealtime boluses based on what he is eating or his planned  activity level; he is generally more active at work and relatively less active on Sundays   He was concerned about his blood sugars being markedly increased on Sunday night and Monday morning with the reading registering high at 8 PM and did not come down to normal to the next day at lunchtime.  Not clear why his sugars were higher and also higher this morning        Side effects from medications have been: None  Compliance with the medical regimen: Fairly good Hypoglycemia:   as above  Glucose monitoring:  done 1-3  times a day         Glucometer:  One Touch     Blood Glucose readings by time of day reviewed from   Mean values apply above for all meters except median for One Touch  PRE-MEAL Fasting Lunch Dinner Bedtime Overall  Glucose range: 65-342 73-315 47-256 143-332   Mean/median: 130 130 113 226 142   Self-care: The diet that the patient has been following is: tries to limit High-fat foods .     Typical meal intake: Breakfast is cereal, lunch is a sandwich and fruit. Lunch 1 pm Dinner is baked chicken, salad and rice.  Snacks are on Peanut butter and crackers, having this at 10. 30 a.m. at work  Dietician visit, most recent: At diagnosis only CDE visit: 7/17               Exercise: On his feet at work.  Occasionally walking on his days off   Weight history: Maximum 285  Wt Readings from Last 3 Encounters:  07/25/16 197 lb (89.4 kg)  06/22/16 191 lb (86.6 kg)  05/25/16 185 lb (83.9 kg)    Glycemic control:   Lab Results  Component Value Date   HGBA1C 7.5 07/25/2016   HGBA1C 10.3 03/24/2016   HGBA1C (H) 08/24/2007    9.7 (NOTE)   The ADA recommends the following therapeutic goals for glycemic   control related to Hgb A1C measurement:   Goal of Therapy:   < 7.0% Hgb A1C   Action Suggested:  > 8.0% Hgb A1C   Ref:  Diabetes Care, 22, Suppl. 1, 1999   Lab Results  Component Value Date   MICROALBUR 9.9 10/15/2015   LDLCALC 81 07/21/2016   CREATININE  1.00 07/21/2016   No results found for: [facility]       Medication List       Accurate as of 07/25/16  8:32 PM. Always use your most recent med list.          ACCU-CHEK AVIVA device by Other route. Use as instructed   ONETOUCH VERIO FLEX SYSTEM w/Device Kit 1 each by Does not apply route 3 (three) times daily.   aspirin 81 MG tablet Take 81 mg by mouth daily.   glucose blood test strip Commonly known as:  ONETOUCH VERIO Use to check blood sugar 3 times per day.   insulin aspart 100 UNIT/ML injection Commonly known as:  NOVOLOG USE MAX 56  UNITS PER DAY WITH V-GO PUMP   metFORMIN 1000 MG tablet Commonly known as:  GLUCOPHAGE Take 1,000 mg by mouth 2 (two) times daily with a meal.   omeprazole 40 MG capsule Commonly known as:  PRILOSEC Take 40 mg by mouth daily.   ONETOUCH DELICA LANCETS 30Q Misc Use to check blood sugar 3 times per day.   V-GO 20 Kit Use 1 per day.       Allergies:  Allergies  Allergen Reactions  . Penicillins Rash    Past Medical History:  Diagnosis Date  . Diabetes mellitus without complication ([place])     No past surgical history on file.  Family History  Problem Relation Age of Onset  . Heart disease Mother   . Diabetes Mother   . Diabetes Sister   . Diabetes Maternal Grandmother     Social History:  reports that he has never smoked. He does not have any smokeless tobacco history on file. He reports that he does not drink alcohol. His drug history is not on file.   Review of Systems   Lipid history: Not on Any statin treatments, initially did have increased cholesterol    Lab Results  Component Value Date   CHOL 154 07/21/2016   HDL 63.70 07/21/2016   LDLCALC 81 07/21/2016   TRIG 45.0 07/21/2016   CHOLHDL 2 07/21/2016           Hypertension: None  Most recent eye exam was 6/17  Most recent foot exam: 7/17  On his initial exam he was felt to have a thyroid nodule but his ultrasound was normal TSH is  normal   LABS:  Office Visit on 07/25/2016  Component Date Value Ref Range Status  . Hemoglobin A1C 07/25/2016 7.5   Final  Lab on 07/21/2016  Component Date Value Ref Range Status  . Sodium 07/21/2016 140  135 - 145 mEq/L Final  . Potassium 07/21/2016 3.7  3.5 - 5.1 mEq/L Final  . Chloride 07/21/2016 103  96 - 112 mEq/L Final  . CO2 07/21/2016 32  19 - 32 mEq/L Final  . Glucose, Bld 07/21/2016 53* 70 - 99 mg/dL Final  . BUN 07/21/2016 13  6 - 23 mg/dL Final  . Creatinine, Ser 07/21/2016 1.00  0.40 - 1.50 mg/dL Final  . Calcium 07/21/2016 9.0  8.4 - 10.5 mg/dL Final  . GFR 07/21/2016 83.04  >60.00 mL/min Final  . TSH 07/21/2016 0.76  0.35 - 4.50 uIU/mL Final  . Cholesterol 07/21/2016 154  0 - 200 mg/dL Final  . Triglycerides 07/21/2016 45.0  0.0 - 149.0 mg/dL Final  . HDL 07/21/2016 63.70  >39.00 mg/dL Final  . VLDL 07/21/2016 9.0  0.0 - 40.0 mg/dL Final  . LDL Cholesterol 07/21/2016 81  0 - 99 mg/dL Final  . Total CHOL/HDL Ratio 07/21/2016 2   Final  . NonHDL 07/21/2016 90.00   Final    Physical Examination:  BP (!) 142/82   Pulse 68   Ht _0  (1.854 m)   Wt 197 lb (89.4 kg)   SpO2 97%   BMI 25.99 kg/m     ASSESSMENT:  Diabetes type 2, uncontrolled    See history of present illness for detailed discussion of current diabetes management, blood sugar patterns and problems identified  He has had long-standing diabetes With previously poor control His blood sugars overall are excellent with A1c now 7.5 However not clear why since his last visit his blood sugars are usually high after evening meal Also gaining some weight back  Most likely needs to reduce his boluses for lunchtime since recently tending to be low normal or low before dinnertime Also probably needs higher bolus coverage at suppertime now His episode of significant hyperglycemia overnight on Sunday was possibly related to his not activating his pump after attaching it  LIPIDS: LDL is below 100  ?   Mild hypertension: We will continue to follow   PLAN for treatment:    Needs to continue the 20 unit basal since fasting readings are usually fairly good  He will reduce his bolus coverage down to 2 units at lunchtime unless planning to be less active or eating a larger meal  Take 6 units coverage for evening meal for usual meals  Adjust mealtime coverage based on amount of food eaten and planned activity, may need more coverage on Sundays especially breakfast if he is eating out  Balanced meals including protein at breakfast  To call if he has any excessive hypoglycemia  He will also need to call the V-go pump hotline if he has persistently very high readings to review the technique of using the pump  New prescription to be given for the V-go pump    Patient Instructions  Try 1 click at lunch on weekdays and 3 clicks at supper for average meal    Counseling time on subjects discussed above is over 50% of today's 25 minute visit  Fredi Geiler 07/25/2016, 8:32 PM   Note: This office note was prepared with Dragon voice recognition system technology. Any transcriptional errors that result from this process are unintentional.

## 2016-07-25 NOTE — Patient Instructions (Signed)
Try 1 click at lunch on weekdays and 3 clicks at supper for average meal

## 2016-07-27 ENCOUNTER — Other Ambulatory Visit: Payer: Self-pay | Admitting: *Deleted

## 2016-07-27 MED ORDER — V-GO 20 KIT: PACK | 2 refills | Status: DC

## 2016-08-01 ENCOUNTER — Other Ambulatory Visit: Payer: Self-pay | Admitting: *Deleted

## 2016-08-01 MED ORDER — V-GO 20 KIT: PACK | 2 refills | Status: DC

## 2016-08-02 ENCOUNTER — Telehealth: Payer: Self-pay | Admitting: Endocrinology

## 2016-08-02 NOTE — Telephone Encounter (Signed)
Patient didn't get your message, he would like a call back.

## 2016-08-02 NOTE — Telephone Encounter (Signed)
Patient wife called she has question about his Vgo pump. Please advise

## 2016-08-02 NOTE — Telephone Encounter (Signed)
Message left informing them that the V-go is a plan exclusion and there is no way to appeal

## 2016-08-03 NOTE — Telephone Encounter (Signed)
I left another message on his voicemail, his insurance declined the approval for his V-go, it is a plan exclusion and there is nothing we can do to get it approved.

## 2016-08-09 ENCOUNTER — Other Ambulatory Visit: Payer: Managed Care, Other (non HMO)

## 2016-08-21 ENCOUNTER — Ambulatory Visit: Payer: Managed Care, Other (non HMO) | Admitting: Endocrinology

## 2016-08-25 ENCOUNTER — Other Ambulatory Visit: Payer: Self-pay | Admitting: Endocrinology

## 2016-08-28 ENCOUNTER — Telehealth: Payer: Self-pay | Admitting: Endocrinology

## 2016-08-28 NOTE — Telephone Encounter (Signed)
Pt's wife just called, stated she was returning Lisa's call regarding Pt's insulin.  Requests call back.

## 2016-08-28 NOTE — Telephone Encounter (Signed)
Patient wife called stated husband do not have any insurance, and want to know if our office have any samples of his insulin. Or can he go to [facility] and buy the Novalog 70/30.  Please advise

## 2016-08-29 NOTE — Telephone Encounter (Signed)
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 Please call the cell number listed. No one is home. Thank you. [phone]

## 2016-08-29 NOTE — Telephone Encounter (Signed)
Spoke with patient and let him know that we do not have any samples at this time

## 2016-11-17 ENCOUNTER — Ambulatory Visit (INDEPENDENT_AMBULATORY_CARE_PROVIDER_SITE_OTHER): Payer: Managed Care, Other (non HMO) | Admitting: Endocrinology

## 2016-11-17 ENCOUNTER — Encounter: Payer: Self-pay | Admitting: Endocrinology

## 2016-11-17 VITALS — BP 124/64 | HR 74 | Ht 73.0 in | Wt 204.0 lb

## 2016-11-17 DIAGNOSIS — Z794 Long term (current) use of insulin: Secondary | ICD-10-CM

## 2016-11-17 DIAGNOSIS — E1165 Type 2 diabetes mellitus with hyperglycemia: Secondary | ICD-10-CM

## 2016-11-17 LAB — POCT GLYCOSYLATED HEMOGLOBIN (HGB A1C): HEMOGLOBIN A1C: 8.2

## 2016-11-17 MED ORDER — INSULIN ASPART 100 UNIT/ML ~~LOC~~ SOLN: SUBCUTANEOUS | 2 refills | Status: DC

## 2016-11-17 NOTE — Progress Notes (Signed)
Patient ID: Christian Yang, male   DOB: [birth year], 56 y.o.   MRN: [id number]           Reason for Appointment: Follow-up for Type 2 Diabetes  Referring physician: Olen Pel   History of Present Illness:          Date of diagnosis of type 2 diabetes mellitus:   2008       Background history:   He apparently had a weight of 285 pounds at diagnosis He was having dry mouth and flulike symptoms at onset and glucose was 672 He was started on metformin is still taking Not clear what other medications he has been taking but he has been on Jardiance for 2 years and Bydureon since 6/17 Records indicated that his A1c has been 10-11+ since at least 2015 Levemir insulin was started 3-4 years ago  Recent history:   INSULIN regimen is:  V-go pump 20.  Boluses usually 4-4-4/6 units at meals        Non-insulin hypoglycemic drugs the patient is taking are: metformin 1 g twice a day  He had marked hyperglycemia with last A1c 10.3 on multiple non-insulin drugs along with Levemir insulin once a day 40 units He was started on the V-go pump in 04/2015  A1c is now higher at 8.2 even though previously was 7.5  Current management, blood sugar patterns and problems identified:   His blood sugars are showing marked fluctuation especially in the mornings  He is now using the REGULAR insulin instead of NovoLog because of cost  Also despite instructions he is taking his boluses) he is eating or afterwards  Also has had a couple of low sugars overnight, mostly around 2 AM especially with eating late  Not clear why some of his FASTING blood sugars are significantly high to the last 4 days he will do blood sugars at suppertime and not consistently high  However he has not any readings AFTER evening meal  Again blood sugars are somewhat variable before lunch but relatively better before supper.  His diet may have been somewhat variable last month  Continues to be fairly active at work  Currently not  adjusting his BOLUSES much based on what he is eating as he is not comfortable counting carbohydrates        Side effects from medications have been: None  Compliance with the medical regimen: Fairly good Hypoglycemia:   as above  Glucose monitoring:  done 1-3  times a day         Glucometer:  One Touch     Blood Glucose readings by time of day reviewed from download  Mean values apply above for all meters except median for One Touch  PRE-MEAL Fasting Lunch Dinner Bedtime Overall  Glucose range: 64-295  78- 243  55-327     Mean/median: 150  134  105   129+/-78     Self-care: The diet that the patient has been following is: tries to limit High-fat foods .     Typical meal intake: Breakfast is cereal, lunch is a sandwich and fruit. Lunch 1 pm Dinner is baked chicken, salad and rice at 7 pm.  Snacks are on Peanut butter and crackers, having this at 10. 30 a.m. at work                Dietician visit, most recent: At diagnosis only CDE visit: 7/17               Exercise:  On his feet at work.  Occasionally walking on his days off   Weight history: Maximum 285  Wt Readings from Last 3 Encounters:  11/17/16 204 lb (92.5 kg)  07/25/16 197 lb (89.4 kg)  06/22/16 191 lb (86.6 kg)    Glycemic control:   Lab Results  Component Value Date   HGBA1C 8.2 11/17/2016   HGBA1C 7.5 07/25/2016   HGBA1C 10.3 03/24/2016   Lab Results  Component Value Date   MICROALBUR 9.9 10/15/2015   LDLCALC 81 07/21/2016   CREATININE 1.00 07/21/2016   No results found for: MICRALBCREAT     Allergies as of 11/17/2016      Reactions   Penicillins Rash      Medication List       Accurate as of 11/17/16 11:59 PM. Always use your most recent med list.          ACCU-CHEK AVIVA device by Other route. Use as instructed   ONETOUCH VERIO FLEX SYSTEM w/Device Kit 1 each by Does not apply route 3 (three) times daily.   aspirin 81 MG tablet Take 81 mg by mouth daily.   glucose blood test  strip Commonly known as:  ONETOUCH VERIO Use to check blood sugar 3 times per day.   insulin aspart 100 UNIT/ML injection Commonly known as:  NOVOLOG USE MAX 56  UNITS PER DAY WITH V-GO PUMP   metFORMIN 1000 MG tablet Commonly known as:  GLUCOPHAGE Take 1,000 mg by mouth 2 (two) times daily with a meal.   omeprazole 40 MG capsule Commonly known as:  PRILOSEC Take 40 mg by mouth daily.   ONETOUCH DELICA LANCETS 78G Misc Use to check blood sugar 3 times per day.   V-GO 20 Kit Use 1 per day       Allergies:  Allergies  Allergen Reactions  . Penicillins Rash    Past Medical History:  Diagnosis Date  . Diabetes mellitus without complication ([place])     No past surgical history on file.  Family History  Problem Relation Age of Onset  . Heart disease Mother   . Diabetes Mother   . Diabetes Sister   . Diabetes Maternal Grandmother     Social History:  reports that he has never smoked. He has never used smokeless tobacco. He reports that he does not drink alcohol. His drug history is not on file.   Review of Systems   Lipid history: Not on Any statin treatments, initially did have increased cholesterol    Lab Results  Component Value Date   CHOL 154 07/21/2016   HDL 63.70 07/21/2016   LDLCALC 81 07/21/2016   TRIG 45.0 07/21/2016   CHOLHDL 2 07/21/2016           Hypertension: Not on any treatment, blood pressure better today  Most recent eye exam was 6/17  Most recent foot exam: 7/17   LABS:  Office Visit on 11/17/2016  Component Date Value Ref Range Status  . Creatinine, Urine 11/17/2016 87.5  Not Estab. mg/dL Final  . Albumin, Urine 11/17/2016 3.3  Not Estab. ug/mL Final  . Microalb/Creat Ratio 11/17/2016 3.8  0.0 - 30.0 mg/g creat Final  . Specific Gravity, UA 11/17/2016      >=1.030* 1.005 - 1.030 Final  . pH, UA 11/17/2016 5.5  5.0 - 7.5 Final  . Color, UA 11/17/2016 Yellow  Yellow Final  . Appearance Ur 11/17/2016 Clear  Clear Final  .  Leukocytes, UA 11/17/2016 Negative  Negative Final  .  Protein, UA 11/17/2016 Negative  Negative/Trace Final  . Glucose, UA 11/17/2016 3+* Negative Final  . Ketones, UA 11/17/2016 Negative  Negative Final  . RBC, UA 11/17/2016 Negative  Negative Final  . Bilirubin, UA 11/17/2016 Negative  Negative Final  . Urobilinogen, Ur 11/17/2016 0.2  0.2 - 1.0 mg/dL Final  . Nitrite, UA 11/17/2016 Negative  Negative Final  . Microscopic Examination 11/17/2016 Comment   Final  . Hemoglobin A1C 11/17/2016 8.2   Final    Physical Examination:  BP 124/64   Pulse 74   Ht '6\' 1"'  (1.854 m)   Wt 204 lb (92.5 kg)   SpO2 97%   BMI 26.91 kg/m     ASSESSMENT:  Diabetes type 2, uncontrolled    See history of present illness for detailed discussion of current diabetes management, blood sugar patterns and problems identified  He has had long-standing diabetes, Requiring basal bolus insulin With the V-go pump he has had overall good control but A1c is increasing now Blood sugars are not as easy to control with his using regular insulin compared to Novolog with this pump Also he is bolusing relatively late at the mealtime or afterwards with his regular insulin Not clear why his fasting readings are so variable  However not able to judge what his evening boluses are doing because of lack of postprandial monitoring despite reminders  PLAN for treatment:    He was given the option of using the freestyle [place] system what he wants to wait  He will try to get the Novolog insulin again  Meanwhile he will try to bolus 15-30 minutes before eating  He will start doing postprandial readings to help adjust his evening dose  He can use 2 clicks at suppertime for his basal [place] 1 click extra for each starch or high-fat item  Consider consultation with dietitian  To call if he has any excessive hypoglycemia  He does however not feel interested in doing basal bolus insulin with multiple injections at this  time  Continue metformin twice a day   Patient Instructions  More sugars at bedtime  Click 92-92 min before nmeals  Counseling time on subjects discussed above is over 50% of today's 25 minute visit  Iolani Twilley 11/19/2016, 2:50 PM   Note: This office note was prepared with Estate agent. Any transcriptional errors that result from this process are unintentional.

## 2016-11-17 NOTE — Patient Instructions (Signed)
More sugars at bedtime  Click 15-30 min before nmeals

## 2016-11-18 LAB — MICROALBUMIN / CREATININE URINE RATIO
Creatinine, Urine: 87.5 mg/dL
MICROALB/CREAT RATIO: 3.8 mg/g{creat} (ref 0.0–30.0)
MICROALBUM., U, RANDOM: 3.3 ug/mL

## 2016-11-18 LAB — URINALYSIS, ROUTINE W REFLEX MICROSCOPIC
Bilirubin, UA: NEGATIVE
Ketones, UA: NEGATIVE
Leukocytes, UA: NEGATIVE
NITRITE UA: NEGATIVE
PH UA: 5.5 (ref 5.0–7.5)
PROTEIN UA: NEGATIVE
RBC, UA: NEGATIVE
Specific Gravity, UA: >=1.03 — AB (ref 1.005–1.030)
Urobilinogen, Ur: 0.2 mg/dL (ref 0.2–1.0)

## 2016-11-27 ENCOUNTER — Other Ambulatory Visit: Payer: Self-pay

## 2016-11-27 ENCOUNTER — Telehealth: Payer: Self-pay | Admitting: Endocrinology

## 2016-11-27 MED ORDER — INSULIN LISPRO 100 UNIT/ML ~~LOC~~ SOLN: SUBCUTANEOUS | 3 refills | Status: DC

## 2016-11-27 NOTE — Telephone Encounter (Signed)
Ordered 11/27/16 

## 2016-11-27 NOTE — Telephone Encounter (Signed)
Pt's wife called in and said that his insurance will no longer cover the Novolog and instead will cover Humulog.  The [facility] [place].

## 2017-02-13 ENCOUNTER — Other Ambulatory Visit: Payer: Self-pay | Admitting: Endocrinology

## 2017-02-23 ENCOUNTER — Encounter: Payer: Self-pay | Admitting: Endocrinology

## 2017-02-23 ENCOUNTER — Ambulatory Visit (INDEPENDENT_AMBULATORY_CARE_PROVIDER_SITE_OTHER): Payer: Managed Care, Other (non HMO) | Admitting: Endocrinology

## 2017-02-23 VITALS — BP 152/94 | HR 69 | Ht 73.0 in | Wt 210.6 lb

## 2017-02-23 DIAGNOSIS — Z794 Long term (current) use of insulin: Secondary | ICD-10-CM

## 2017-02-23 DIAGNOSIS — E1165 Type 2 diabetes mellitus with hyperglycemia: Secondary | ICD-10-CM

## 2017-02-23 LAB — POCT GLYCOSYLATED HEMOGLOBIN (HGB A1C): HEMOGLOBIN A1C: 7.9

## 2017-02-23 MED ORDER — FREESTYLE LIBRE READER DEVI: 1.0000 | 0 refills | Status: DC

## 2017-02-23 MED ORDER — FREESTYLE LIBRE SENSOR SYSTEM MISC: 3 refills | Status: DC

## 2017-02-23 NOTE — Progress Notes (Signed)
Patient ID: Christian Yang, male   DOB: 1961/03/01, 56 y.o.   MRN: [id number]           Reason for Appointment: Follow-up for Type 2 Diabetes  Referring physician: Olen Pel   History of Present Illness:          Date of diagnosis of type 2 diabetes mellitus:   2008       Background history:   He apparently had a weight of 285 pounds at diagnosis He was having dry mouth and flulike symptoms at onset and glucose was 672 He was started on metformin is still taking Not clear what other medications he has been taking but he has been on Jardiance for 2 years and Bydureon since 6/17 Records indicated that his A1c has been 10-11+ since at least 2015 Levemir insulin was started 3-4 years ago  Recent history:   INSULIN regimen is:  V-go pump 20.  Boluses usually 4-4-4/6 units at meals        Non-insulin hypoglycemic drugs the patient is taking are: metformin 1 g twice a day  He had marked hyperglycemia with last A1c 10.3 on multiple non-insulin drugs along with Levemir insulin once a day 40 units He was started on the V-go pump in 04/2015  A1c is now still relatively high at 7.9 usually has been down to 7.5  Current management, blood sugar patterns and problems identified:   His blood sugars are showing marked fluctuation in the mornings again  Despite reminders he does not check his readings after supper and has only a couple of readings which were done either in follow-up of a high reading or once when he felt hypoglycemic  After much discussion he finally admits that he may have snacks or sweets such as ice cream after supper without any boluses  Blood sugars in the evenings do not correlate well with the next mornings blood sugar  Overall blood sugars are not is high at lunch and suppertime although has sporadic high readings at those times also  He continues to gain weight since last year  On his last visit he was told to try switching back to Humalog instead of regular insulin,  this is more affordable now  Continues to be fairly active at work  Currently not adjusting his BOLUSES  based on what he is eating as he is not comfortable counting carbohydrates  He has had only a couple of readings in the 60s, once after supper and no other documented hypoglycemia        Side effects from medications have been: None  Compliance with the medical regimen: Variable  Hypoglycemia:   as above  Glucose monitoring:  done 1-3  times a day         Glucometer:  One Touch     Blood Glucose readings by time of day reviewed from download  Mean values apply above for all meters except median for One Touch  PRE-MEAL Fasting Lunch Dinner Bedtime Overall  Glucose range: 70-285    84-3 26  62, 70    Mean/median: 147  121  124   126+/-68    POST-MEAL PC Breakfast PC Lunch PC Dinner  Glucose range:     Mean/median:        Mean values apply above for all meters except median for One Touch  PRE-MEAL Fasting Lunch Dinner Bedtime Overall  Glucose range: 64-295  78- 243  55-327     Mean/median: 150  134  105  129+/-78     Self-care: The diet that the patient has been following is: tries to limit High-fat foods .     Typical meal intake: Breakfast is cereal, lunch is a sandwich and fruit. Lunch 1 pm Dinner is baked chicken, salad and rice at 7 pm.  Snacks are on Peanut butter and crackers, having this at 10. 30 a.m. at work                Dietician visit, most recent: At diagnosis only CDE visit: 7/17               Exercise: On his feet at work.  Occasionally walking on his days off   Weight history: Maximum 285  Wt Readings from Last 3 Encounters:  02/23/17 210 lb 9.6 oz (95.5 kg)  11/17/16 204 lb (92.5 kg)  07/25/16 197 lb (89.4 kg)    Glycemic control:   Lab Results  Component Value Date   HGBA1C 7.9 02/23/2017   HGBA1C 8.2 11/17/2016   HGBA1C 7.5 07/25/2016   Lab Results  Component Value Date   MICROALBUR 9.9 10/15/2015   LDLCALC 81 07/21/2016    CREATININE 1.00 07/21/2016   No results found for: MICRALBCREAT     Allergies as of 02/23/2017      Reactions   Penicillins Rash      Medication List       Accurate as of 02/23/17 11:59 PM. Always use your most recent med list.          ACCU-CHEK AVIVA device by Other route. Use as instructed   ONETOUCH VERIO FLEX SYSTEM w/Device Kit 1 each by Does not apply route 3 (three) times daily.   aspirin 81 MG tablet Take 81 mg by mouth daily.   FREESTYLE LIBRE READER Devi 1 Device by Does not apply route as directed.   [place] Misc Apply to upper arm and change sensor every 10 days   glucose blood test strip Commonly known as:  ONETOUCH VERIO Use to check blood sugar 3 times per day.   insulin lispro 100 UNIT/ML injection Commonly known as:  HUMALOG USE MAX 56 UNITS PER DAY WITH V-GO PUMP   metFORMIN 1000 MG tablet Commonly known as:  GLUCOPHAGE Take 1,000 mg by mouth 2 (two) times daily with a meal.   omeprazole 40 MG capsule Commonly known as:  PRILOSEC Take 40 mg by mouth daily.   ONETOUCH DELICA LANCETS 29N Misc Use to check blood sugar 3 times per day.   V-GO 20 Kit USE ONE PER DAY AS DIRECTED       Allergies:  Allergies  Allergen Reactions  . Penicillins Rash    Past Medical History:  Diagnosis Date  . Diabetes mellitus without complication ([place])     No past surgical history on file.  Family History  Problem Relation Age of Onset  . Heart disease Mother   . Diabetes Mother   . Diabetes Sister   . Diabetes Maternal Grandmother     Social History:  reports that he has never smoked. He has never used smokeless tobacco. He reports that he does not drink alcohol. His drug history is not on file.   Review of Systems   Lipid history: Not on Any statin treatments, initially did have increased cholesterol    Lab Results  Component Value Date   CHOL 154 07/21/2016   HDL 63.70 07/21/2016   LDLCALC 81 07/21/2016    TRIG 45.0 07/21/2016  CHOLHDL 2 07/21/2016           Hypertension: Not on any treatment, blood pressure better today  Most recent eye exam was 6/17  Most recent foot exam: 7/17   LABS:  Office Visit on 02/23/2017  Component Date Value Ref Range Status  . Hemoglobin A1C 02/23/2017 7.9   Final    Physical Examination:  BP (!) 152/94   Pulse 69   Ht '6\' 1"'  (1.854 m)   Wt 210 lb 9.6 oz (95.5 kg)   SpO2 97%   BMI 27.79 kg/m     ASSESSMENT:  Diabetes type 2, uncontrolled    See history of present illness for detailed discussion of current diabetes management, blood sugar patterns and problems identified  He has had long-standing diabetes, Requiring basal bolus insulin With the V-go pump he has had overall good control but A1c is Is consistently above target Not clear why his fasting readings are so variable But may be related to his snacking at night or variable fat intake in the evening May be having high readings after supper are his evening snacks as his sugars are not consistently high in the morning and median is only 147  However not able to judge what his evening boluses are doing because of lack of postprandial monitoring despite reminders  PLAN for treatment:    He was given the option of using the freestyle [place] system And discussed in detail how this would be helpful since he is not monitoring after supper and after other meals also  This will enable [place] to change his treatment, help him adjust boluses based on his type of meals and help reduce his A1c long-term  Although he may be a good candidate for [employer] this may cause potentially low sugars waking up which have been as low as 70  He will start bolusing 2-4 units for large snacks or sweets after supper  Again reminded him to change him consistently at the same time   Patient Instructions  Check for sugars at bedtime daily  Click for snacks 1-2 x  Counseling time on subjects discussed above is  over 50% of today's 25 minute visit  Christian Yang 02/25/2017, 9:31 PM   Note: This office note was prepared with Dragon voice recognition system technology. Any transcriptional errors that result from this process are unintentional.

## 2017-02-23 NOTE — Patient Instructions (Addendum)
Check for sugars at bedtime daily  Click for snacks 1-2 x

## 2017-04-04 ENCOUNTER — Other Ambulatory Visit: Payer: Self-pay | Admitting: Endocrinology

## 2017-04-21 LAB — HM DIABETES EYE EXAM

## 2017-05-24 ENCOUNTER — Encounter: Payer: Self-pay | Admitting: Endocrinology

## 2017-05-24 ENCOUNTER — Other Ambulatory Visit: Payer: Self-pay

## 2017-05-24 ENCOUNTER — Ambulatory Visit (INDEPENDENT_AMBULATORY_CARE_PROVIDER_SITE_OTHER): Payer: 59 | Admitting: Endocrinology

## 2017-05-24 VITALS — BP 122/76 | HR 77 | Ht 73.0 in | Wt 210.0 lb

## 2017-05-24 DIAGNOSIS — Z794 Long term (current) use of insulin: Secondary | ICD-10-CM | POA: Diagnosis not present

## 2017-05-24 DIAGNOSIS — E7800 Pure hypercholesterolemia, unspecified: Secondary | ICD-10-CM

## 2017-05-24 DIAGNOSIS — E1165 Type 2 diabetes mellitus with hyperglycemia: Secondary | ICD-10-CM

## 2017-05-24 LAB — URINALYSIS, ROUTINE W REFLEX MICROSCOPIC
Bilirubin Urine: NEGATIVE
HGB URINE DIPSTICK: NEGATIVE
KETONES UR: NEGATIVE
Leukocytes, UA: NEGATIVE
NITRITE: NEGATIVE
PH: 6 (ref 5.0–8.0)
Specific Gravity, Urine: >=1.03 — AB (ref 1.000–1.030)
Urine Glucose: NEGATIVE
Urobilinogen, UA: 0.2 (ref 0.0–1.0)

## 2017-05-24 LAB — POCT GLYCOSYLATED HEMOGLOBIN (HGB A1C): Hemoglobin A1C: 7.5

## 2017-05-24 LAB — BASIC METABOLIC PANEL
BUN: 17 mg/dL (ref 6–23)
CHLORIDE: 105 meq/L (ref 96–112)
CO2: 29 mEq/L (ref 19–32)
CREATININE: 1.22 mg/dL (ref 0.40–1.50)
Calcium: 9.6 mg/dL (ref 8.4–10.5)
GFR: 65.8 mL/min (ref >60.00)
GLUCOSE: 169 mg/dL — AB (ref 70–99)
Potassium: 4.4 mEq/L (ref 3.5–5.1)
Sodium: 139 mEq/L (ref 135–145)

## 2017-05-24 LAB — LIPID PANEL
CHOL/HDL RATIO: 4
Cholesterol: 198 mg/dL (ref 0–200)
HDL: 53.2 mg/dL (ref >39.00)
LDL Cholesterol: 129 mg/dL — ABNORMAL HIGH (ref 0–99)
NONHDL: 145.21
TRIGLYCERIDES: 81 mg/dL (ref 0.0–149.0)
VLDL: 16.2 mg/dL (ref 0.0–40.0)

## 2017-05-24 LAB — MICROALBUMIN / CREATININE URINE RATIO
Creatinine,U: 227.9 mg/dL
MICROALB UR: 1.5 mg/dL (ref 0.0–1.9)
Microalb Creat Ratio: 0.7 mg/g (ref 0.0–30.0)

## 2017-05-24 MED ORDER — METFORMIN HCL 1000 MG PO TABS: 1000.0000 mg | ORAL_TABLET | Freq: Two times a day (BID) | ORAL | 5 refills | Status: DC

## 2017-05-24 MED ORDER — INSULIN LISPRO 100 UNIT/ML ~~LOC~~ SOLN: SUBCUTANEOUS | 3 refills | Status: DC

## 2017-05-24 NOTE — Patient Instructions (Addendum)
Must check sugars after supper

## 2017-05-24 NOTE — Progress Notes (Signed)
Patient ID: Christian Yang, male   DOB: 04-Sep-1961, 56 y.o.   MRN: [id number]           Reason for Appointment: Follow-up for Type 2 Diabetes  Referring physician: Olen Pel   History of Present Illness:          Date of diagnosis of type 2 diabetes mellitus:   2008       Background history:   He apparently had a weight of 285 pounds at diagnosis He was having dry mouth and flulike symptoms at onset and glucose was 672 He was started on metformin is still taking Not clear what other medications he has been taking but he has been on Jardiance for 2 years and Bydureon since 6/17 Records indicated that his A1c has been 10-11+ since at least 2015 Levemir insulin was started 3-4 years ago  Recent history:   INSULIN regimen is:  V-go pump 20.  Boluses usually 4-4-4/6 units at meals        Non-insulin hypoglycemic drugs the patient is taking are: metformin 1 g twice a day  He had marked hyperglycemia with last A1c 10.3 on multiple non-insulin drugs along with Levemir insulin once a day 40 units He was started on the V-go pump in 04/2015  A1c is now slightly better at 7.5, last reading was 7.9  Current management, blood sugar patterns and problems identified:   He was advised to start the [place] sensor on the last visit but he still has not started doing this  He cannot explain why his blood sugars in the mornings are fluctuating markedly as before  This is despite is not having any excessive snacks or high-fat meals in the evening  Despite reminders he does not check his readings much after meals and only sporadically before and after supper; most of his blood sugars are fasting and lunchtime  FASTING blood sugars appear to be overall on an average higher but may be as low as 78  HYPOGLYCEMIA: He has had 2 episodes of low blood sugars between 9-11 PM about 3-4 weeks ago, once after eating spaghetti without much protein but he does not know why his sugars were low otherwise  No  overnight hypoglycemia  He is still requiring very little bolus insulin but will take a little more insulin if eating a larger meal in the evening  Lunchtime blood sugars are mostly near her target  Side effects from diabetes medications have been: None  Hypoglycemia:   as above  Glucose monitoring:  done 2  times a day         Glucometer:  One Touch     Blood Glucose readings by time of day reviewed from download  Mean values apply above for all meters except median for One Touch  PRE-MEAL Fasting Lunch Dinner Bedtime Overall  Glucose range:  78-247  88-1 79  69-214     Mean/median: 160  117  83   135+/-48    POST-MEAL PC Breakfast PC Lunch PC Dinner  Glucose range:   39-1 72   Mean/median:        Self-care: The diet that the patient has been following is: tries to limit High-fat foods .     Typical meal intake: Breakfast is cereal, lunch is a sandwich and fruit. Lunch 1 pm Dinner is baked chicken, salad and rice at 7 pm .   Snacks are on Peanut butter and crackers, having this at 10. 30 a.m. at work  Dietician visit, most recent: At diagnosis only CDE visit: 7/17               Exercise: On his feet at work.    Weight history: Maximum 285  Wt Readings from Last 3 Encounters:  05/24/17 210 lb (95.3 kg)  02/23/17 210 lb 9.6 oz (95.5 kg)  11/17/16 204 lb (92.5 kg)    Glycemic control:   Lab Results  Component Value Date   HGBA1C 7.5 05/24/2017   HGBA1C 7.9 02/23/2017   HGBA1C 8.2 11/17/2016   Lab Results  Component Value Date   MICROALBUR 1.5 05/24/2017   LDLCALC 129 (H) 05/24/2017   CREATININE 1.22 05/24/2017   Lab Results  Component Value Date   MICRALBCREAT 0.7 05/24/2017       Allergies as of 05/24/2017      Reactions   Penicillins Rash      Medication List       Accurate as of 05/24/17 11:59 PM. Always use your most recent med list.          ACCU-CHEK AVIVA device by Other route. Use as instructed   ONETOUCH VERIO FLEX  SYSTEM w/Device Kit 1 each by Does not apply route 3 (three) times daily.   aspirin 81 MG tablet Take 81 mg by mouth daily.   FREESTYLE LIBRE READER Devi 1 Device by Does not apply route as directed.   [place] Misc Apply to upper arm and change sensor every 10 days   glucose blood test strip Commonly known as:  ONETOUCH VERIO Use to check blood sugar 3 times per day.   insulin lispro 100 UNIT/ML injection Commonly known as:  HUMALOG USE MAX 56 UNITS PER DAY WITH V-GO PUMP   metFORMIN 1000 MG tablet Commonly known as:  GLUCOPHAGE Take 1 tablet (1,000 mg total) by mouth 2 (two) times daily with a meal.   omeprazole 40 MG capsule Commonly known as:  PRILOSEC Take 40 mg by mouth daily.   ONETOUCH DELICA LANCETS 24M Misc Use to check blood sugar 3 times per day.   V-GO 20 Kit USE ONE PER DAY AS DIRECTED       Allergies:  Allergies  Allergen Reactions  . Penicillins Rash    Past Medical History:  Diagnosis Date  . Diabetes mellitus without complication ([place])     No past surgical history on file.  Family History  Problem Relation Age of Onset  . Heart disease Mother   . Diabetes Mother   . Diabetes Sister   . Diabetes Maternal Grandmother     Social History:  reports that he has never smoked. He has never used smokeless tobacco. He reports that he does not drink alcohol. His drug history is not on file.   Review of Systems   Lipid history: Not on Any statin treatments, Previously did have increased cholesterol    Lab Results  Component Value Date   CHOL 198 05/24/2017   HDL 53.20 05/24/2017   LDLCALC 129 (H) 05/24/2017   TRIG 81.0 05/24/2017   CHOLHDL 4 05/24/2017           Hypertension: Not on any treatment, blood pressure ok today  Most recent eye exam was 6/18  Most recent foot exam: 7/17   LABS:  Office Visit on 05/24/2017  Component Date Value Ref Range Status  . Hemoglobin A1C 05/24/2017 7.5   Final  . Sodium  05/24/2017 139  135 - 145 mEq/L Final  . Potassium  05/24/2017 4.4  3.5 - 5.1 mEq/L Final  . Chloride 05/24/2017 105  96 - 112 mEq/L Final  . CO2 05/24/2017 29  19 - 32 mEq/L Final  . Glucose, Bld 05/24/2017 169* 70 - 99 mg/dL Final  . BUN 05/24/2017 17  6 - 23 mg/dL Final  . Creatinine, Ser 05/24/2017 1.22  0.40 - 1.50 mg/dL Final  . Calcium 05/24/2017 9.6  8.4 - 10.5 mg/dL Final  . GFR 05/24/2017 65.80  >60.00 mL/min Final  . Microalb, Ur 05/24/2017 1.5  0.0 - 1.9 mg/dL Final  . Creatinine,U 05/24/2017 227.9  mg/dL Final  . Microalb Creat Ratio 05/24/2017 0.7  0.0 - 30.0 mg/g Final  . Color, Urine 05/24/2017 YELLOW  Yellow;Lt. Yellow Final  . APPearance 05/24/2017 CLEAR  Clear Final  . Specific Gravity, Urine 05/24/2017 >=1.030* 1.000 - 1.030 Final  . pH 05/24/2017 6.0  5.0 - 8.0 Final  . Total Protein, Urine 05/24/2017 TRACE* Negative Final  . Urine Glucose 05/24/2017 NEGATIVE  Negative Final  . Ketones, ur 05/24/2017 NEGATIVE  Negative Final  . Bilirubin Urine 05/24/2017 NEGATIVE  Negative Final  . Hgb urine dipstick 05/24/2017 NEGATIVE  Negative Final  . Urobilinogen, UA 05/24/2017 0.2  0.0 - 1.0 Final  . Leukocytes, UA 05/24/2017 NEGATIVE  Negative Final  . Nitrite 05/24/2017 NEGATIVE  Negative Final  . WBC, UA 05/24/2017 0-2/hpf  0-2/hpf Final  . Mucus, UA 05/24/2017 Presence of* None Final  . Squamous Epithelial / LPF 05/24/2017 Rare(0-4/hpf)  Rare(0-4/hpf) Final  . Hyaline Casts, UA 05/24/2017 Presence of* None Final  . Sperm, UA 05/24/2017 Presence of* None Final  . Cholesterol 05/24/2017 198  0 - 200 mg/dL Final   ATP III Classification       Desirable:  < 200 mg/dL               Borderline High:  200 - 239 mg/dL          High:  > = 240 mg/dL  . Triglycerides 05/24/2017 81.0  0.0 - 149.0 mg/dL Final   Normal:  <150 mg/dLBorderline High:  150 - 199 mg/dL  . HDL 05/24/2017 53.20  >39.00 mg/dL Final  . VLDL 05/24/2017 16.2  0.0 - 40.0 mg/dL Final  . LDL Cholesterol  05/24/2017 129* 0 - 99 mg/dL Final  . Total CHOL/HDL Ratio 05/24/2017 4   Final                  Men          Women1/2 Average Risk     3.4          3.3Average Risk          5.0          4.42X Average Risk          9.6          7.13X Average Risk          15.0          11.0                      . NonHDL 05/24/2017 145.21   Final   NOTE:  Non-HDL goal should be 30 mg/dL higher than patient's LDL goal (i.e. LDL goal of < 70 mg/dL, would have non-HDL goal of < 100 mg/dL)    Physical Examination:  BP 122/76   Pulse 77   Ht '6\' 1"'  (1.854 m)   Wt 210 lb (95.3 kg)  SpO2 97%   BMI 27.71 kg/m     ASSESSMENT:  Diabetes type 2, uncontrolled    See history of present illness for detailed discussion of current diabetes management, blood sugar patterns and problems identified  He has had long-standing diabetes, Requiring basal bolus insulin  His A1c 7.5 and not clear why this is relatively high since his blood sugars averaging only 135 at home However is not checking readings after meals Need better information on his blood sugar patterns especially overnight since he has significant variability in morning sugars Discussed day-to-day management of his diabetes and insulin coverage as well as glucose monitoring, blood sugar targets  However not able to judge what his evening boluses are doing because of lack of postprandial monitoring despite reminders  PLAN for treatment:    He Was started using the freestyle [place] sensor unless it is too expensive, he should at least try to do at for 10 days a month  Otherwise he needs to check his sugars regularly after supper to help adjust his boluses and identify foods that are making his blood sugars go higher  He will call if he has any difficulty with overnight blood sugar  No change in basal rate of the insulin pump Also needs to have consistent amount of protein with every meal  LIPIDS: Discussed needing to recheck his labs and start statin  drugs if LDL over 100   Patient Instructions  Must check sugars after supper Counseling time on subjects discussed in assessment and plan sections is over 50% of today's 25 minute visit   Saarah Dewing 05/25/2017, 9:58 AM   Note: This office note was prepared with Estate agent. Any transcriptional errors that result from this process are unintentional.  ADDENDUM: LDL 129: He will start Crestor 5 mg daily

## 2017-05-25 ENCOUNTER — Telehealth: Payer: Self-pay | Admitting: Endocrinology

## 2017-05-25 ENCOUNTER — Other Ambulatory Visit: Payer: Self-pay

## 2017-05-25 MED ORDER — ROSUVASTATIN CALCIUM 5 MG PO TABS: 5.0000 mg | ORAL_TABLET | Freq: Every day | ORAL | 3 refills | Status: DC

## 2017-05-25 NOTE — Telephone Encounter (Signed)
Patient's wife called in reference to patient needing a refill on Insulin Disposable Pump (V-GO 20) KIT  by tomorrow. Patient's wife called in to see if he can pick up 3 refills at the office due to needing this by tomorrow.   Please call patient and advise. OK to leave message.

## 2017-05-25 NOTE — Telephone Encounter (Signed)
Routing t you

## 2017-05-30 NOTE — Telephone Encounter (Signed)
Spoke with pt's wife to follow up and be sure everything was addressed. Pt wife was very kind, not upset about the situation. She stated they were able to get samples of the vgo on Friday and were not without Vgo devices at all.

## 2017-06-13 ENCOUNTER — Other Ambulatory Visit: Payer: Self-pay | Admitting: Endocrinology

## 2017-07-03 ENCOUNTER — Telehealth: Payer: Self-pay

## 2017-07-03 NOTE — Telephone Encounter (Signed)
PA for V-Go 20 kit through [email or web address] was initiated. Awaiting approval. Key [id number]

## 2017-07-14 ENCOUNTER — Other Ambulatory Visit: Payer: Self-pay | Admitting: Endocrinology

## 2017-07-17 ENCOUNTER — Other Ambulatory Visit: Payer: Self-pay | Admitting: Endocrinology

## 2017-08-24 ENCOUNTER — Encounter: Payer: Self-pay | Admitting: Endocrinology

## 2017-08-24 ENCOUNTER — Ambulatory Visit (INDEPENDENT_AMBULATORY_CARE_PROVIDER_SITE_OTHER): Payer: 59 | Admitting: Endocrinology

## 2017-08-24 VITALS — BP 146/96 | HR 88 | Ht 73.0 in | Wt 217.4 lb

## 2017-08-24 DIAGNOSIS — E1165 Type 2 diabetes mellitus with hyperglycemia: Secondary | ICD-10-CM

## 2017-08-24 DIAGNOSIS — Z794 Long term (current) use of insulin: Secondary | ICD-10-CM | POA: Diagnosis not present

## 2017-08-24 LAB — POCT GLYCOSYLATED HEMOGLOBIN (HGB A1C): HEMOGLOBIN A1C: 7.3

## 2017-08-24 MED ORDER — ROSUVASTATIN CALCIUM 5 MG PO TABS: 5.0000 mg | ORAL_TABLET | Freq: Every day | ORAL | 3 refills | Status: DC

## 2017-08-24 NOTE — Progress Notes (Signed)
Patient ID: Christian Yang, male   DOB: September 02, 1961, 56 y.o.   MRN: [id number]           Reason for Appointment: Follow-up for Type 2 Diabetes  Referring physician: Olen Pel   History of Present Illness:          Date of diagnosis of type 2 diabetes mellitus:   2008       Background history:   He apparently had a weight of 285 pounds at diagnosis He was having dry mouth and flulike symptoms at onset and glucose was 672 He was started on metformin is still taking Not clear what other medications he has been taking but he has been on Jardiance for 2 years and Bydureon since 6/17 Records indicated that his A1c has been 10-11+ since at least 2015 Levemir insulin was started 3-4 years ago  Recent history:   INSULIN regimen is:  V-go pump 20.  Boluses usually 4-4-4/6 units at meals        Non-insulin hypoglycemic drugs the patient is taking are: metformin 1 g twice a day  He had marked hyperglycemia with last A1c 10.3 on multiple non-insulin drugs along with Levemir insulin once a day 40 units He was started on the V-go pump in 04/2015  A1c is now slightly better at 7.3, last reading was 7.5  Current management, blood sugar patterns and problems identified:  He was advised to start the [place] sensor on the last visit but he still has not started doing this because he says he did not instead how to start this and has not made appointment with nurse educator for instructions  Blood sugars can be over 200 also in the morning with he does not know this correlates with his diet the night before  Despite repeated reminders he still has not checked readings after meals at night because he thinks he is generally going to bed within 2 hours of his evening meal  This is despite is not having any excessive snacks or high-fat meals in the evening  Despite reminders he does not check his readings much after meals and only sporadically before and after supper; most of his blood sugars are fasting  and lunchtime  HYPOGLYCEMIA: He has had 2 episodes of low blood sugars between 9-11 PM within the last month and still does not know why this happened  No overnight hypoglycemia  He is usually taking his bolus clicks before each meal consistently and may adjust the dose in the evening if he is eating more carbohydrates or larger meals  Lunchtime blood sugars are low normal at times and averaging only about 102 without more than one low blood sugar  Again does not check blood sugars after breakfast or lunch even on weekends   Side effects from diabetes medications have been: None  Hypoglycemia:   as above  Glucose monitoring:  done 2  times a day         Glucometer:  One Touch     Blood Glucose readings by time of day reviewed from download  Mean values apply above for all meters except median for One Touch  PRE-MEAL Fasting Lunch Dinner Bedtime Overall  Glucose range: 93-243 63-162  23, 56   Mean/median: 159+/-45    125+/-51      Self-care: The diet that the patient has been following is: tries to limit High-fat foods .     Typical meal intake: Breakfast is cereal, lunch is a sandwich and fruit. Lunch  1 pm Dinner is baked chicken, salad and rice at 7 pm .   Snacks are on Peanut butter and crackers, having this at 10. 30 a.m. at work                Dietician visit, most recent: At diagnosis only CDE visit: 7/17               Exercise: On his feet at work.    Weight history: Maximum 285  Wt Readings from Last 3 Encounters:  08/24/17 217 lb 6.4 oz (98.6 kg)  05/24/17 210 lb (95.3 kg)  02/23/17 210 lb 9.6 oz (95.5 kg)    Glycemic control:   Lab Results  Component Value Date   HGBA1C 7.3 08/24/2017   HGBA1C 7.5 05/24/2017   HGBA1C 7.9 02/23/2017   Lab Results  Component Value Date   MICROALBUR 1.5 05/24/2017   LDLCALC 129 (H) 05/24/2017   CREATININE 1.22 05/24/2017   Lab Results  Component Value Date   MICRALBCREAT 0.7 05/24/2017       Allergies as  of 08/24/2017      Reactions   Penicillins Rash      Medication List        Accurate as of 08/24/17 11:59 PM. Always use your most recent med list.          ACCU-CHEK AVIVA device by Other route. Use as instructed   ONETOUCH VERIO w/Device Kit USE TO CHECK BLOOD SUGAR 3 TIMES DAILY   aspirin 81 MG tablet Take 81 mg by mouth daily.   FREESTYLE LIBRE READER Devi 1 Device by Does not apply route as directed.   [place] Misc Apply to upper arm and change sensor every 10 days   insulin lispro 100 UNIT/ML injection Commonly known as:  HUMALOG USE MAX 56 UNITS PER DAY WITH V-GO PUMP   metFORMIN 1000 MG tablet Commonly known as:  GLUCOPHAGE Take 1 tablet (1,000 mg total) by mouth 2 (two) times daily with a meal.   omeprazole 40 MG capsule Commonly known as:  PRILOSEC Take 40 mg by mouth daily.   ONETOUCH DELICA LANCETS 13Y Misc Use to check blood sugar 3 times per day.   ONETOUCH VERIO test strip Generic drug:  glucose blood USE TO CHECK BLOOD SUGAR 3 TIMES DAILY   rosuvastatin 5 MG tablet Commonly known as:  CRESTOR Take 1 tablet (5 mg total) by mouth daily.   V-GO 20 Kit USE ONE PER DAY AS DIRECTED       Allergies:  Allergies  Allergen Reactions  . Penicillins Rash    Past Medical History:  Diagnosis Date  . Diabetes mellitus without complication ([place])     No past surgical history on file.  Family History  Problem Relation Age of Onset  . Heart disease Mother   . Diabetes Mother   . Diabetes Sister   . Diabetes Maternal Grandmother     Social History:  reports that  has never smoked. he has never used smokeless tobacco. He reports that he does not drink alcohol. His drug history is not on file.   Review of Systems   Lipid history: He did not start his Crestor as directed, somewhat concerned about possible side effects    Lab Results  Component Value Date   CHOL 198 05/24/2017   HDL 53.20 05/24/2017   LDLCALC 129  (H) 05/24/2017   TRIG 81.0 05/24/2017   CHOLHDL 4 05/24/2017  Hypertension: Not on any treatment, blood pressure is unusually high today He says that he has a wrist instrument at home which reads very accurately and his blood pressure is  normal   Most recent eye exam was 6/18  Most recent foot exam: 11/18   LABS:  Office Visit on 08/24/2017  Component Date Value Ref Range Status  . Hemoglobin A1C 08/24/2017 7.3   Final    Physical Examination:  BP (!) 146/96   Pulse 88   Ht '6\' 1"'  (1.854 m)   Wt 217 lb 6.4 oz (98.6 kg)   SpO2 97%   BMI 28.68 kg/m    Repeat 142/90  Diabetic Foot Exam - Simple   Simple Foot Form Diabetic Foot exam was performed with the following findings:  Yes   Visual Inspection No deformities, no ulcerations, no other skin breakdown bilaterally:  Yes Sensation Testing Intact to touch and monofilament testing bilaterally:  Yes Pulse Check Posterior Tibialis and Dorsalis pulse intact bilaterally:  Yes Comments     ASSESSMENT:  Diabetes type 2, uncontrolled    See history of present illness for detailed discussion of current diabetes management, blood sugar patterns and problems identified  He has had long-standing diabetes, Requiring basal bolus insulin on the  V-go pump for at least 2 years Also on metformin  His A1c 7.3 FASTING blood sugars averaging about 150-160 but still show unexplained variability However is not checking readings after meals  Discussed day-to-day management of his diabetes and options for better management and monitoring  ?  Hypertension: He will follow-up with his PCP and also bring his monitor for comparison  LIPIDS: Discussed is high LDL and needs to start Crestor reassured him that this is safe and he can even take this every other day if needed to start with, discussed benefits  PLAN for treatment:    He was given instructions on how to start the freestyle [place] and if he has difficulty he can call  the nurse educator for an appointment  Discussed the need for better blood sugar pattern identification especially after meals which she does not do normally  In the meantime he can start checking blood sugars at bedtime regardless  Recommended the Omnipod insulin pump and showed him in detail how this works but he is somewhat reluctant to do this as he thinks it may be more complicated  However the V-go is still relatively expensive for him currently and he will look into the cost  Recommended that he keep track of his diet with his monitoring after meals to see what insulin adjustment needs at suppertime at least   LIPIDS: Discussed needing to recheck his labs and start statin drugs if LDL over 100   Patient Instructions  More sugars after meals  Counseling time on subjects discussed in assessment and plan sections is over 50% of today's 25 minute visit   [place] Shannahan 08/26/2017, 9:12 PM   Note: This office note was prepared with Dragon voice recognition system technology. Any transcriptional errors that result from this process are unintentional.  ADDENDUM: LDL 129: He will start Crestor 5 mg daily

## 2017-08-24 NOTE — Patient Instructions (Signed)
More sugars after meals 

## 2017-10-04 ENCOUNTER — Other Ambulatory Visit: Payer: Self-pay | Admitting: Endocrinology

## 2017-11-14 ENCOUNTER — Other Ambulatory Visit: Payer: Self-pay | Admitting: Endocrinology

## 2017-11-19 ENCOUNTER — Telehealth: Payer: Self-pay | Admitting: Endocrinology

## 2017-11-19 NOTE — Telephone Encounter (Signed)
Patient's insurance company does NOT cover Humalog. Patient needs a script for insulin that is covered (he was on a different insulin before Humalog) [facility] [place]

## 2017-11-21 ENCOUNTER — Other Ambulatory Visit: Payer: Self-pay

## 2017-11-21 MED ORDER — INSULIN ASPART 100 UNIT/ML ~~LOC~~ SOLN: SUBCUTANEOUS | 3 refills | Status: DC

## 2017-11-26 NOTE — Telephone Encounter (Signed)
Called patient and left a voice message to let him know that I see that someone has [facility] [place] [facility] [facility] [place] [place] and wanted to make sure that they were able to pick this up from the pharmacy.

## 2017-11-30 ENCOUNTER — Encounter: Payer: Self-pay | Admitting: Endocrinology

## 2017-11-30 ENCOUNTER — Ambulatory Visit: Payer: BLUE CROSS/BLUE SHIELD | Admitting: Endocrinology

## 2017-11-30 VITALS — BP 138/90 | HR 72 | Ht 73.0 in | Wt 216.4 lb

## 2017-11-30 DIAGNOSIS — E7800 Pure hypercholesterolemia, unspecified: Secondary | ICD-10-CM

## 2017-11-30 DIAGNOSIS — Z794 Long term (current) use of insulin: Secondary | ICD-10-CM | POA: Diagnosis not present

## 2017-11-30 DIAGNOSIS — E1165 Type 2 diabetes mellitus with hyperglycemia: Secondary | ICD-10-CM

## 2017-11-30 NOTE — Patient Instructions (Addendum)
3-4 clicks if not biscuit in am , for oatmeal etc   Protein in am  3 clicks at lunch if more carbs  More checks at bedtime  Take 1-3 clicks at dinner

## 2017-11-30 NOTE — Progress Notes (Addendum)
Patient ID: Christian Yang, male   DOB: [birth year], 57 y.o.   MRN: [id number]           Reason for Appointment: Follow-up for Type 2 Diabetes  Referring physician: Olen Pel   History of Present Illness:          Date of diagnosis of type 2 diabetes mellitus:   2008       Background history:   He apparently had a weight of 285 pounds at diagnosis He was having dry mouth and flulike symptoms at onset and glucose was 672 He was started on metformin is still taking Not clear what other medications he has been taking but he has been on Jardiance for 2 years and Bydureon since 6/17 Records indicated that his A1c has been 10-11+ since at least 2015 Levemir insulin was started 3-4 years ago  Recent history:   INSULIN regimen is:  V-go pump 20.  Boluses usually 4-4-4/6 units at meals        Non-insulin hypoglycemic drugs the patient is taking are: metformin 1 g twice a day  He had marked hyperglycemia with last A1c 10.3 on multiple non-insulin drugs along with Levemir insulin once a day 40 units He was started on the V-go pump in 04/2015  His A1c is consistently over 7%, now 7.8 compared to 7.3 previously  Current management, blood sugar patterns and problems identified:  He started using the FreeStyle libre sensor 3 weeks ago and previously had difficulty going  Although his overall blood sugar is averaging only 151 he has significant postprandial hyperglycemia which is variable  HIGHEST blood sugars are after breakfast and lunch  He is having somewhat of variable carbohydrate intake at breakfast and usually with cereal or oatmeal his blood sugars go up significantly  After breakfast blood sugar range is between about 150-250 and may be better only if he is eating relatively low carbohydrate intake such as a biscuit yesterday  Some blood sugars are persistently high until lunch  Also he has variable increase in blood sugar after lunch but with more variability  AFTER dinner his  blood sugars are not consistently high and he has had a couple of low sugar episodes also late at night  He is not judging how much insulin he has to take when he is eating if he takes the same amount at dinner and has a low carbohydrate low-fat meal he will start getting low late in the evening, this has occurred twice recently  Glucose readings are usually excellent BEFORE lunch  He thinks that his freestyle libre sensor may be a little lower than the actual reading but he has not compared to readings recently   Side effects from diabetes medications have been: None  Hypoglycemia:   as above  Glucose monitoring:  done 2  times a day         Glucometer:  Freestyle Libre  recently    Blood Glucose readings by time of day reviewed from download   Mean values apply above for all meters except median for One Touch  PRE-MEAL Fasting Lunch Dinner Bedtime Overall  Glucose range:       Mean/median:  115     151+/-64   POST-MEAL PC Breakfast PC Lunch PC Dinner  Glucose range:     Mean/median:  196  197  158   CGM use %  66  Average and SD  151+/-64  Time in range  61  % Time Above 180  30  % Time above 250   % Time Below target  9      Self-care: The diet that the patient has been following is: tries to limit High-fat foods .     Typical meal intake: Breakfast is cereal, sometimes oatmeal or biscuits, lunch is a sandwich and fruit. Lunch 1 pm Dinner is baked chicken, salad and rice at 7 pm .   Snacks are on Peanut butter and crackers, having this at 10. 30 a.m. at work                Dietician visit, most recent: At diagnosis only CDE visit: 7/17               Exercise: On his feet at work.    Weight history: Maximum 285  Wt Readings from Last 3 Encounters:  11/30/17 216 lb 6.4 oz (98.2 kg)  08/24/17 217 lb 6.4 oz (98.6 kg)  05/24/17 210 lb (95.3 kg)    Glycemic control:   Lab Results  Component Value Date   HGBA1C 7.3 08/24/2017   HGBA1C 7.5 05/24/2017   HGBA1C  7.9 02/23/2017   Lab Results  Component Value Date   MICROALBUR 1.5 05/24/2017   LDLCALC 54 11/30/2017   CREATININE 1.04 11/30/2017   Lab Results  Component Value Date   MICRALBCREAT 0.7 05/24/2017       Allergies as of 11/30/2017      Reactions   Penicillins Rash      Medication List        Accurate as of 11/30/17 11:59 PM. Always use your most recent med list.          aspirin 81 MG tablet Take 81 mg by mouth daily.   FREESTYLE LIBRE READER Devi 1 Device by Does not apply route as directed.   [place] Misc Apply to upper arm and change sensor every 10 days   insulin aspart 100 UNIT/ML injection Commonly known as:  NOVOLOG USE MAX 56 UNITS WITH V-GO PUMP   insulin lispro 100 UNIT/ML injection Commonly known as:  HUMALOG USE MAX 56 UNITS PER DAY WITH V-GO PUMP   metFORMIN 1000 MG tablet Commonly known as:  GLUCOPHAGE TAKE ONE TABLET BY MOUTH TWICE DAILY WITH MEALS   omeprazole 40 MG capsule Commonly known as:  PRILOSEC Take 40 mg by mouth daily.   ONETOUCH DELICA LANCETS 38V Misc Use to check blood sugar 3 times per day.   ONETOUCH VERIO test strip Generic drug:  glucose blood USE TO CHECK BLOOD SUGAR 3 TIMES DAILY   ONETOUCH VERIO w/Device Kit USE TO CHECK BLOOD SUGAR 3 TIMES DAILY   rosuvastatin 5 MG tablet Commonly known as:  CRESTOR Take 1 tablet (5 mg total) by mouth daily.   V-GO 20 Kit USE ONE PER DAY AS DIRECTED       Allergies:  Allergies  Allergen Reactions  . Penicillins Rash    Past Medical History:  Diagnosis Date  . Diabetes mellitus without complication ([place])     History reviewed. No pertinent surgical history.  Family History  Problem Relation Age of Onset  . Heart disease Mother   . Diabetes Mother   . Diabetes Sister   . Diabetes Maternal Grandmother     Social History:  reports that  has never smoked. he has never used smokeless tobacco. He reports that he does not drink alcohol. His  drug history is not on file.   Review of Systems  Lipid history: He did start his Crestor as directed, no side effects currently      Lab Results  Component Value Date   CHOL 122 11/30/2017   HDL 52 11/30/2017   LDLCALC 54 11/30/2017   TRIG 78 11/30/2017   CHOLHDL 2.3 11/30/2017           Hypertension: Not on any treatment, blood pressure is again somewhat high   Most recent eye exam was 6/18  Most recent foot exam: 11/18   LABS:  Office Visit on 11/30/2017  Component Date Value Ref Range Status  . Glucose 11/30/2017 215* 65 - 99 mg/dL Final  . BUN 11/30/2017 12  6 - 24 mg/dL Final  . Creatinine, Ser 11/30/2017 1.04  0.76 - 1.27 mg/dL Final  . GFR calc non Af Amer 11/30/2017 81  >59 mL/min/1.73 Final  . GFR calc Af Amer 11/30/2017 94  >59 mL/min/1.73 Final  . BUN/Creatinine Ratio 11/30/2017 12  9 - 20 Final  . Sodium 11/30/2017 141  134 - 144 mmol/L Final  . Potassium 11/30/2017 4.6  3.5 - 5.2 mmol/L Final  . Chloride 11/30/2017 102  96 - 106 mmol/L Final  . CO2 11/30/2017 23  20 - 29 mmol/L Final  . Calcium 11/30/2017 10.5* 8.7 - 10.2 mg/dL Final  . Total Protein 11/30/2017 7.2  6.0 - 8.5 g/dL Final  . Albumin 11/30/2017 4.6  3.5 - 5.5 g/dL Final  . Globulin, Total 11/30/2017 2.6  1.5 - 4.5 g/dL Final  . Albumin/Globulin Ratio 11/30/2017 1.8  1.2 - 2.2 Final  . Bilirubin Total 11/30/2017 0.4  0.0 - 1.2 mg/dL Final  . Alkaline Phosphatase 11/30/2017 67  39 - 117 IU/L Final  . AST 11/30/2017 15  0 - 40 IU/L Final  . ALT 11/30/2017 17  0 - 44 IU/L Final  . Cholesterol, Total 11/30/2017 122  100 - 199 mg/dL Final  . Triglycerides 11/30/2017 78  0 - 149 mg/dL Final  . HDL 11/30/2017 52  >39 mg/dL Final  . VLDL Cholesterol Cal 11/30/2017 16  5 - 40 mg/dL Final  . LDL Calculated 11/30/2017 54  0 - 99 mg/dL Final  . Chol/HDL Ratio 11/30/2017 2.3  0.0 - 5.0 ratio Final   Comment:                                   T. Chol/HDL Ratio                                              Men  Women                               1/2 Avg.Risk  3.4    3.3                                   Avg.Risk  5.0    4.4                                2X Avg.Risk  9.6    7.1  3X Avg.Risk 23.4   11.0     Physical Examination:  BP 138/90 (BP Location: Left Arm, Patient Position: Sitting, Cuff Size: Normal)   Pulse 72   Ht '6\' 1"'  (1.854 m)   Wt 216 lb 6.4 oz (98.2 kg)   SpO2 96%   BMI 28.55 kg/m     ASSESSMENT:  Diabetes type 2, uncontrolled    See history of present illness for detailed discussion of current diabetes management, blood sugar patterns and problems identified  He has had long-standing diabetes, Requiring basal bolus insulin on the  V-go pump for at least 2 years Also on metformin  His A1c is still not adequate and higher at 7.8 today  His blood sugars are now being evaluated with the freestyle libre sensor as discussed above Although he has reasonably good fasting blood sugars with the current basal rate he has postprandial hyperglycemia depending on what he is eating especially at breakfast and lunch Also may be over bolusing at times in the evenings at dinner which may cause some hypoglycemia that may be relatively prolonged also  Freestyle sensor readings are somewhat lower with the readings and he needs to compare this to the fingersticks again   ?  Hypertension: He needs to discuss with his PCP and also take his home monitor comparison to his PCP   LIPIDS: He has had hypercholesterolemia with diabetes and needs to be on a statin, has started Crestor as recommended   PLAN for treatment:     He was given instructions on how to start the freestyle [employer] and if he has difficulty he can call the nurse educator for an appointment  Discussed the need for adjusting his mealtime doses more consistently based on what he is eating and he can take the same amount for breakfast when eating a low carbohydrate meal but at least  2-4 units more been eating higher carbohydrate meal  Also he needs to add a protein in the morning and discussed sources  The same applies to lunch where he may be periodically eating higher carbohydrate meals or fast food  Most likely he needs somewhat less insulin on an average at dinnertime since he may not be getting as much carbohydrates and lower fat meals; he may be able to get by with only one click at dinnertime for low glycemic meal and discussed some examples  He does need to see the dietitian for better understanding  Also again discussed in detail the differences between the current therapy and the Omnipod pump and he will look into the cost of this compared to the V-go   LIPIDS: Check labs today to see efficacy of his Crestor    Patient Instructions  3-4 clicks if not biscuit in am , for oatmeal etc   Protein in am  3 clicks at lunch if more carbs  More checks at bedtime  Take 1-3 clicks at dinner  Counseling time on subjects discussed in assessment and plan sections is over 50% of today's 25 minute visit   Elayne Snare 12/02/2017, 12:13 PM   Note: This office note was prepared with Dragon voice recognition system technology. Any transcriptional errors that result from this process are unintentional.  ADDENDUM: LDL 54 on Crestor, continue the same

## 2017-12-01 LAB — COMPREHENSIVE METABOLIC PANEL
ALT: 17 IU/L (ref 0–44)
AST: 15 IU/L (ref 0–40)
Albumin/Globulin Ratio: 1.8 (ref 1.2–2.2)
Albumin: 4.6 g/dL (ref 3.5–5.5)
Alkaline Phosphatase: 67 IU/L (ref 39–117)
BUN/Creatinine Ratio: 12 (ref 9–20)
BUN: 12 mg/dL (ref 6–24)
Bilirubin Total: 0.4 mg/dL (ref 0.0–1.2)
CALCIUM: 10.5 mg/dL — AB (ref 8.7–10.2)
CO2: 23 mmol/L (ref 20–29)
CREATININE: 1.04 mg/dL (ref 0.76–1.27)
Chloride: 102 mmol/L (ref 96–106)
GFR calc Af Amer: 94 mL/min/{1.73_m2} (ref >59)
GFR, EST NON AFRICAN AMERICAN: 81 mL/min/{1.73_m2} (ref >59)
GLOBULIN, TOTAL: 2.6 g/dL (ref 1.5–4.5)
Glucose: 215 mg/dL — ABNORMAL HIGH (ref 65–99)
Potassium: 4.6 mmol/L (ref 3.5–5.2)
SODIUM: 141 mmol/L (ref 134–144)
Total Protein: 7.2 g/dL (ref 6.0–8.5)

## 2017-12-01 LAB — LIPID PANEL
CHOL/HDL RATIO: 2.3 ratio (ref 0.0–5.0)
Cholesterol, Total: 122 mg/dL (ref 100–199)
HDL: 52 mg/dL (ref >39)
LDL CALC: 54 mg/dL (ref 0–99)
TRIGLYCERIDES: 78 mg/dL (ref 0–149)
VLDL CHOLESTEROL CAL: 16 mg/dL (ref 5–40)

## 2017-12-02 ENCOUNTER — Encounter: Payer: Self-pay | Admitting: Endocrinology

## 2017-12-06 ENCOUNTER — Telehealth: Payer: Self-pay

## 2017-12-06 NOTE — Telephone Encounter (Signed)
Called pt. No answer. Will try later. No dpr on file.  

## 2017-12-06 NOTE — Telephone Encounter (Signed)
-----   Message from Reather LittlerAjay Kumar, MD sent at 12/02/2017 12:13 PM EST ----- Cholesterol is excellent, continue Crestor.  Liver test okay

## 2017-12-07 NOTE — Telephone Encounter (Signed)
Called pt. No answer °

## 2017-12-18 ENCOUNTER — Other Ambulatory Visit: Payer: Self-pay | Admitting: Endocrinology

## 2017-12-24 ENCOUNTER — Other Ambulatory Visit: Payer: Self-pay

## 2017-12-24 ENCOUNTER — Telehealth: Payer: Self-pay | Admitting: Endocrinology

## 2017-12-24 ENCOUNTER — Encounter (HOSPITAL_COMMUNITY): Payer: Self-pay | Admitting: Emergency Medicine

## 2017-12-24 ENCOUNTER — Emergency Department (HOSPITAL_COMMUNITY)
Admission: EM | Admit: 2017-12-24 | Discharge: 2017-12-25 | Disposition: A | Discharge status: Home / Self Care | Payer: BLUE CROSS/BLUE SHIELD | Attending: Emergency Medicine | Admitting: Emergency Medicine

## 2017-12-24 DIAGNOSIS — Z794 Long term (current) use of insulin: Secondary | ICD-10-CM | POA: Insufficient documentation

## 2017-12-24 DIAGNOSIS — E119 Type 2 diabetes mellitus without complications: Secondary | ICD-10-CM

## 2017-12-24 DIAGNOSIS — Z7982 Long term (current) use of aspirin: Secondary | ICD-10-CM | POA: Diagnosis not present

## 2017-12-24 DIAGNOSIS — Z79899 Other long term (current) drug therapy: Secondary | ICD-10-CM | POA: Insufficient documentation

## 2017-12-24 DIAGNOSIS — E162 Hypoglycemia, unspecified: Secondary | ICD-10-CM

## 2017-12-24 DIAGNOSIS — IMO0001 Reserved for inherently not codable concepts without codable children: Secondary | ICD-10-CM

## 2017-12-24 DIAGNOSIS — R030 Elevated blood-pressure reading, without diagnosis of hypertension: Secondary | ICD-10-CM | POA: Diagnosis not present

## 2017-12-24 DIAGNOSIS — R531 Weakness: Secondary | ICD-10-CM | POA: Diagnosis not present

## 2017-12-24 DIAGNOSIS — E11649 Type 2 diabetes mellitus with hypoglycemia without coma: Secondary | ICD-10-CM | POA: Insufficient documentation

## 2017-12-24 DIAGNOSIS — R509 Fever, unspecified: Secondary | ICD-10-CM | POA: Diagnosis not present

## 2017-12-24 DIAGNOSIS — R61 Generalized hyperhidrosis: Secondary | ICD-10-CM | POA: Diagnosis not present

## 2017-12-24 LAB — CBC
HCT: 38.4 % — ABNORMAL LOW (ref 39.0–52.0)
HEMOGLOBIN: 12.9 g/dL — AB (ref 13.0–17.0)
MCH: 28.9 pg (ref 26.0–34.0)
MCHC: 33.6 g/dL (ref 30.0–36.0)
MCV: 86.1 fL (ref 78.0–100.0)
PLATELETS: 252 10*3/uL (ref 150–400)
RBC: 4.46 MIL/uL (ref 4.22–5.81)
RDW: 12 % (ref 11.5–15.5)
WBC: 6.6 10*3/uL (ref 4.0–10.5)

## 2017-12-24 LAB — CBG MONITORING, ED
Glucose-Capillary: 237 mg/dL — ABNORMAL HIGH (ref 65–99)
Glucose-Capillary: 248 mg/dL — ABNORMAL HIGH (ref 65–99)

## 2017-12-24 LAB — URINALYSIS, ROUTINE W REFLEX MICROSCOPIC
BILIRUBIN URINE: NEGATIVE
Glucose, UA: 150 mg/dL — AB
Hgb urine dipstick: NEGATIVE
KETONES UR: NEGATIVE mg/dL
LEUKOCYTES UA: NEGATIVE
Nitrite: NEGATIVE
Protein, ur: NEGATIVE mg/dL
SPECIFIC GRAVITY, URINE: 1.018 (ref 1.005–1.030)
pH: 7 (ref 5.0–8.0)

## 2017-12-24 LAB — BASIC METABOLIC PANEL
ANION GAP: 9 (ref 5–15)
BUN: 15 mg/dL (ref 6–20)
CHLORIDE: 104 mmol/L (ref 101–111)
CO2: 26 mmol/L (ref 22–32)
Calcium: 9.3 mg/dL (ref 8.9–10.3)
Creatinine, Ser: 1.12 mg/dL (ref 0.61–1.24)
GFR calc Af Amer: >60 mL/min (ref >60)
Glucose, Bld: 256 mg/dL — ABNORMAL HIGH (ref 65–99)
POTASSIUM: 4.5 mmol/L (ref 3.5–5.1)
SODIUM: 139 mmol/L (ref 135–145)

## 2017-12-24 NOTE — ED Triage Notes (Signed)
Pt brought in by EMS from home with c/o hypoglycemia.  Pt reported that he "has not been feeling well since morning"---- a symptom he knows that his "blood sugar is low".  Pt checked his blood sugar prior to calling out EMS and got a reading of 54.  Pt self-corrected his low blood sugar and when EMS arrived on scene, his CBG was 150.  His BP by EMS was 194/102 and was concerned about it--- pt has no hx of hypertension.

## 2017-12-24 NOTE — Telephone Encounter (Signed)
Patient wants to switch from 10 day FreeStyle Libre reader to a 14 day [place] [employer] Libre reader. [facility] [place]. If that is okay-does patient need a new meter? Please advise patient at ph# [phone]

## 2017-12-24 NOTE — ED Provider Notes (Signed)
[facility] DEPT Provider Note   CSN: [id number] Arrival date & time: 12/24/17  2224     History   Chief Complaint Chief Complaint  Patient presents with  . Hypoglycemia    HPI Christian Yang is a 57 y.o. male with a hx of IDDM presents to the Emergency Department complaining of gradual, persistent, progressively worsening hypoglycemia onset around 9pm. Pt reports his CBG was 56.  Associated symptoms included shaking, nausea, sweating and feeling lightheaded.  Pt reports drinking orange juice and eating some ice cream with improvement to 80 by the time EMS arrived, 150 at recheck and 248 upon arrival in the ED.  Pt reports eating dinner around 7pm, which was normal and low carb.  Pt reports he gave himself 2 units of insulin before eating.  He is also taking Metformin BID and did take it before dinner.  Pt reports his BP was also high when using his home BP cuff during the episode which made him concerned.  Pt denies fever, chills, headache, neck pain, Chest pain, SOB, V/D, syncope, urinary symptoms. Pt reports he is feeling back to normal at this time. Last A1C was 7.8.  Pt reports he started using a V-go approx 1.5 years ago, but began using the Libra insulin sensor 4-6 weeks ago.  He reports since that time he has had a few episodes of hypoglycemia that he has managed at home.  He reports sometimes the sensor does not correlate with his finger stick machine.  He reports his hypoglycemic episodes seem to be mostly at night.    Record review shows that patient's insulin regimen is: V-go pump 20.  Boluses usually 4-4-4/6 units at meals.     The history is provided by the patient and medical records. No language interpreter was used.    Past Medical History:  Diagnosis Date  . Diabetes mellitus without complication [facility])     Patient Active Problem List   Diagnosis Date Noted  . Diabetes mellitus without complication ([place]) 00/17/4944  . OBESITY  03/12/2008  . ESOPHAGEAL MOTILITY DISORDER 03/12/2008  . GASTROESOPHAGEAL REFLUX DISEASE 03/12/2008    History reviewed. No pertinent surgical history.     Home Medications    Prior to Admission medications   Medication Sig Start Date End Date Taking? Authorizing Provider  aspirin 81 MG tablet Take 81 mg by mouth daily.   Yes [provider]  insulin aspart (NOVOLOG) 100 UNIT/ML injection USE MAX 56 UNITS WITH V-GO PUMP 11/21/17  Yes Elayne Snare, MD  metFORMIN (GLUCOPHAGE) 1000 MG tablet TAKE ONE TABLET BY MOUTH TWICE DAILY WITH MEALS 11/15/17  Yes Elayne Snare, MD  omeprazole (PRILOSEC) 40 MG capsule Take 40 mg by mouth daily.   Yes [provider]  rosuvastatin (CRESTOR) 5 MG tablet TAKE 1 TABLET BY MOUTH EVERY DAY 12/18/17  Yes Elayne Snare, MD  Blood Glucose Monitoring Suppl (ONETOUCH VERIO) w/Device KIT USE TO CHECK BLOOD SUGAR 3 TIMES DAILY 07/14/17   Elayne Snare, MD  Continuous Blood Gluc Receiver (FREESTYLE LIBRE READER) DEVI 1 Device by Does not apply route as directed. 02/23/17   Elayne Snare, MD  Continuous Blood Gluc Sensor ([place]) MISC Apply to upper arm and change sensor every 10 days 02/23/17   Elayne Snare, MD  Insulin Disposable Pump (V-GO 20) KIT USE ONE PER DAY AS DIRECTED 11/15/17   Elayne Snare, MD  insulin lispro (HUMALOG) 100 UNIT/ML injection USE MAX 56 UNITS PER DAY WITH V-GO  PUMP Patient not taking: Reported on 11/30/2017 10/04/17   Elayne Snare, MD  [facility] DELICA LANCETS 73S MISC Use to check blood sugar 3 times per day. 05/25/16   Elayne Snare, MD  [facility] VERIO test strip USE TO CHECK BLOOD SUGAR 3 TIMES DAILY 07/17/17   Elayne Snare, MD    Family History Family History  Problem Relation Age of Onset  . Heart disease Mother   . Diabetes Mother   . Diabetes Sister   . Diabetes Maternal Grandmother     Social History Social History   Tobacco Use  . Smoking status: Never Smoker  . Smokeless tobacco: Never Used  Substance Use  Topics  . Alcohol use: No    Alcohol/week: 0.0 oz  . Drug use: Not on file     Allergies   Penicillins   Review of Systems Review of Systems  Constitutional: Positive for diaphoresis and fever. Negative for appetite change, fatigue and unexpected weight change.  HENT: Negative for mouth sores.   Eyes: Negative for visual disturbance.  Respiratory: Negative for cough, chest tightness, shortness of breath and wheezing.   Cardiovascular: Negative for chest pain.  Gastrointestinal: Negative for abdominal pain, constipation, diarrhea, nausea and vomiting.  Endocrine: Negative for polydipsia, polyphagia and polyuria.  Genitourinary: Negative for dysuria, frequency, hematuria and urgency.  Musculoskeletal: Negative for back pain and neck stiffness.  Skin: Negative for rash.  Allergic/Immunologic: Negative for immunocompromised state.  Neurological: Positive for tremors and weakness (generalized). Negative for syncope, light-headedness and headaches.  Hematological: Does not bruise/bleed easily.  Psychiatric/Behavioral: Negative for sleep disturbance. The patient is not nervous/anxious.      Physical Exam Updated Vital Signs BP (!) 174/88 (BP Location: Left Arm)   Pulse 86   Resp 11   SpO2 97%   Physical Exam  Constitutional: He appears well-developed and well-nourished. No distress.  Awake, alert, nontoxic appearance  HENT:  Head: Normocephalic and atraumatic.  Mouth/Throat: Oropharynx is clear and moist. No oropharyngeal exudate.  Eyes: Conjunctivae are normal. No scleral icterus.  Neck: Normal range of motion. Neck supple.  Cardiovascular: Normal rate, regular rhythm and intact distal pulses.  Pulmonary/Chest: Effort normal and breath sounds normal. No respiratory distress. He has no wheezes.  Equal chest expansion  Abdominal: Soft. Bowel sounds are normal. He exhibits no mass. There is no tenderness. There is no rebound and no guarding.  Musculoskeletal: Normal range of  motion. He exhibits no edema.  Neurological: He is alert.  Speech is clear and goal oriented Moves extremities without ataxia  Skin: Skin is warm and dry. He is not diaphoretic.  Psychiatric: He has a normal mood and affect.  Nursing note and vitals reviewed.    ED Treatments / Results  Labs (all labs ordered are listed, but only abnormal results are displayed) Labs Reviewed  URINALYSIS, ROUTINE W REFLEX MICROSCOPIC - Abnormal; Notable for the following components:      Result Value   Glucose, UA 150 (*)    All other components within normal limits  CBC - Abnormal; Notable for the following components:   Hemoglobin 12.9 (*)    HCT 38.4 (*)    All other components within normal limits  BASIC METABOLIC PANEL - Abnormal; Notable for the following components:   Glucose, Bld 256 (*)    All other components within normal limits  CBG MONITORING, ED - Abnormal; Notable for the following components:   Glucose-Capillary 248 (*)    All other components within normal limits  CBG  MONITORING, ED - Abnormal; Notable for the following components:   Glucose-Capillary 237 (*)    All other components within normal limits  CBG MONITORING, ED  CBG MONITORING, ED    Procedures Procedures (including critical care time)  Medications Ordered in ED Medications - No data to display   Initial Impression / Assessment and Plan / ED Course  I have reviewed the triage vital signs and the nursing notes.  Pertinent labs & imaging results that were available during my care of the patient were reviewed by me and considered in my medical decision making (see chart for details).  Clinical Course as of Dec 26 30  Mon Dec 24, 2017  2327 Pt tolerating PO without difficulty  [HM]  Tue Dec 25, 2017  0010 CBG holding steady Glucose-Capillary: (!) 237 [HM]  0023 Mild anemia.  When compared to previous labs in 2008 this is new.  Patient denies dark or tarry stools or known bleeding. Hemoglobin: (!) 12.9 [HM]     Clinical Course User Index [HM] Maleeha Halls, Jarrett Soho, PA-C    Presents after hypoglycemic episode.  He is not currently taking any long-acting hyperglycemic agents.  He did take his insulin and metformin today.  Patient reports some intermittent hypoglycemia in the last several weeks.  Additionally,  noted to be hypertensive in the emergency department.  No signs of hypertensive urgency.  Labs are reassuring.  Patient without infectious symptoms.  Urinalysis without evidence of urinary tract infection.  Mild anemia is noted however patient denies dark tarry stools or hematuria.  He has no chest pain or shortness of breath on exertion.  Patient has tolerated p.o. here in the emergency department.  Glucose has held steady for several hours.  Patient reports he is ready for discharge home.  I have discussed the need for close follow-up by his primary care physician for his hypertension and by endocrinology for his hypoglycemia.  Discussed with patient and wife that he needs to return immediately to the emergency department for any recurrence of hypoglycemia.  Patient is to hold his metformin until he can have a discussion with his endocrinologist about hypoglycemic episodes.  Patient states understanding and is in agreement with this plan.   Final Clinical Impressions(s) / ED Diagnoses   Final diagnoses:  Hypoglycemia  IDDM (insulin dependent diabetes mellitus) [facility])    ED Discharge Orders    None       Loni Muse Gwenlyn Perking 12/25/17 0033    Julianne Rice, MD 12/26/17 1550

## 2017-12-25 ENCOUNTER — Telehealth: Payer: Self-pay | Admitting: Endocrinology

## 2017-12-25 DIAGNOSIS — R030 Elevated blood-pressure reading, without diagnosis of hypertension: Secondary | ICD-10-CM | POA: Diagnosis not present

## 2017-12-25 DIAGNOSIS — Z1211 Encounter for screening for malignant neoplasm of colon: Secondary | ICD-10-CM | POA: Diagnosis not present

## 2017-12-25 DIAGNOSIS — E162 Hypoglycemia, unspecified: Secondary | ICD-10-CM | POA: Diagnosis not present

## 2017-12-25 MED ORDER — FREESTYLE LIBRE 14 DAY SENSOR MISC: 1.0000 | 11 refills | Status: DC

## 2017-12-25 MED ORDER — FREESTYLE LIBRE 14 DAY READER DEVI: 1.0000 | Freq: Once | 0 refills | Status: AC

## 2017-12-25 NOTE — Telephone Encounter (Signed)
Patient had to go to Er due to blood sugars being low and blood pressure went way, way up. Hemoglobin was 12.9. ER Dr. Catalina Pizzaold pt to call Dr. Lucianne MussKumar to see if he needs appointment this week or phone advice. ER DR. told pt not to take Metformin until discussing with Dr. Lucianne MussKumar. Please call pt at ph# [phone], if no answer call cell# [phone] to advise. Also, wife called yesterday re: pt is on 10 day FreeStyle Libre but pt needs 14 day FreeStyle Libre.  Pt is out -has no more left. Pt also wants to know if he needs a new reader. [facility] [place]

## 2017-12-25 NOTE — Discharge Instructions (Signed)
1. Medications: usual home medications - Hold metformin until further discussion with Dr. Lucianne MussKumar 2. Treatment: rest, drink plenty of fluids,  3. Follow Up: Please followup with your primary doctor in 2-3 days for discussion of your hight blood pressure and further evaluation after today's visit; if you do not have a primary care doctor use the resource guide provided to find one; Please return to the ER for any return of hypoglycemia, new or worsening symptoms

## 2017-12-25 NOTE — Telephone Encounter (Signed)
He does not need to reduce his metformin.  He probably took too much insulin for his evening meal and needs to cut back on how much he is taking for evening meal based on how much he is carbohydrate he is getting.  We can see him back for follow-up as soon as possible

## 2017-12-25 NOTE — Telephone Encounter (Signed)
Sent Freestyle Libre 14 day, advise on message below about appt

## 2017-12-26 ENCOUNTER — Telehealth: Payer: Self-pay | Admitting: Emergency Medicine

## 2017-12-26 NOTE — Telephone Encounter (Signed)
Left mess for patient to call back.  

## 2017-12-26 NOTE — Telephone Encounter (Signed)
Pt's wife informed of below and transferred to scheduler to make f/u.

## 2017-12-26 NOTE — Telephone Encounter (Signed)
See 12/25/17 phone note. 

## 2017-12-26 NOTE — Telephone Encounter (Signed)
Patients spouse returned your call. Please return her call on her cell thanks.

## 2018-01-21 ENCOUNTER — Telehealth: Payer: Self-pay | Admitting: *Deleted

## 2018-01-21 NOTE — Telephone Encounter (Signed)
V-Go 20 kit PA initiated via CoverMyMeds.

## 2018-02-11 ENCOUNTER — Ambulatory Visit: Payer: BLUE CROSS/BLUE SHIELD | Admitting: Endocrinology

## 2018-02-14 ENCOUNTER — Other Ambulatory Visit: Payer: Self-pay | Admitting: Endocrinology

## 2018-03-12 ENCOUNTER — Other Ambulatory Visit: Payer: Self-pay

## 2018-03-12 ENCOUNTER — Telehealth: Payer: Self-pay | Admitting: Endocrinology

## 2018-03-12 MED ORDER — GLUCOSE BLOOD VI STRP: ORAL_STRIP | 5 refills | Status: DC

## 2018-03-12 NOTE — Telephone Encounter (Signed)
ONETOUCH VERIO test strip  Patient needs a refill sent into the pharmacy.     [facility] [place] - [address]

## 2018-03-12 NOTE — Telephone Encounter (Signed)
Medication sent to pharmacy per patient request.  ?

## 2018-03-15 ENCOUNTER — Ambulatory Visit: Payer: BLUE CROSS/BLUE SHIELD | Admitting: Endocrinology

## 2018-03-15 ENCOUNTER — Other Ambulatory Visit: Payer: Self-pay | Admitting: Endocrinology

## 2018-03-21 ENCOUNTER — Telehealth: Payer: Self-pay

## 2018-03-21 NOTE — Telephone Encounter (Signed)
Received fax from [place] stating that has been denied reimbursement for the V-Go 20 kit at this time. Key: [id number]

## 2018-04-02 ENCOUNTER — Other Ambulatory Visit: Payer: Self-pay

## 2018-04-02 ENCOUNTER — Other Ambulatory Visit: Payer: Self-pay | Admitting: Urology

## 2018-04-02 ENCOUNTER — Emergency Department (HOSPITAL_BASED_OUTPATIENT_CLINIC_OR_DEPARTMENT_OTHER): Payer: BLUE CROSS/BLUE SHIELD

## 2018-04-02 ENCOUNTER — Emergency Department (HOSPITAL_BASED_OUTPATIENT_CLINIC_OR_DEPARTMENT_OTHER)
Admission: EM | Admit: 2018-04-02 | Discharge: 2018-04-02 | Disposition: A | Discharge status: Home / Self Care | Payer: BLUE CROSS/BLUE SHIELD | Attending: Emergency Medicine | Admitting: Emergency Medicine

## 2018-04-02 DIAGNOSIS — N202 Calculus of kidney with calculus of ureter: Secondary | ICD-10-CM | POA: Diagnosis not present

## 2018-04-02 DIAGNOSIS — Z794 Long term (current) use of insulin: Secondary | ICD-10-CM | POA: Diagnosis not present

## 2018-04-02 DIAGNOSIS — E119 Type 2 diabetes mellitus without complications: Secondary | ICD-10-CM | POA: Diagnosis not present

## 2018-04-02 DIAGNOSIS — Z79899 Other long term (current) drug therapy: Secondary | ICD-10-CM | POA: Diagnosis not present

## 2018-04-02 DIAGNOSIS — R030 Elevated blood-pressure reading, without diagnosis of hypertension: Secondary | ICD-10-CM | POA: Diagnosis not present

## 2018-04-02 DIAGNOSIS — N201 Calculus of ureter: Secondary | ICD-10-CM | POA: Insufficient documentation

## 2018-04-02 DIAGNOSIS — N289 Disorder of kidney and ureter, unspecified: Secondary | ICD-10-CM | POA: Diagnosis not present

## 2018-04-02 DIAGNOSIS — R109 Unspecified abdominal pain: Secondary | ICD-10-CM | POA: Diagnosis not present

## 2018-04-02 DIAGNOSIS — N132 Hydronephrosis with renal and ureteral calculous obstruction: Secondary | ICD-10-CM | POA: Diagnosis not present

## 2018-04-02 DIAGNOSIS — R1031 Right lower quadrant pain: Secondary | ICD-10-CM | POA: Diagnosis not present

## 2018-04-02 LAB — COMPREHENSIVE METABOLIC PANEL
ALK PHOS: 59 U/L (ref 38–126)
ALT: 17 U/L (ref 17–63)
ANION GAP: 12 (ref 5–15)
AST: 26 U/L (ref 15–41)
Albumin: 4.1 g/dL (ref 3.5–5.0)
BILIRUBIN TOTAL: 1.1 mg/dL (ref 0.3–1.2)
BUN: 18 mg/dL (ref 6–20)
CALCIUM: 9.4 mg/dL (ref 8.9–10.3)
CO2: 24 mmol/L (ref 22–32)
CREATININE: 1.42 mg/dL — AB (ref 0.61–1.24)
Chloride: 102 mmol/L (ref 101–111)
GFR, EST NON AFRICAN AMERICAN: 55 mL/min — AB (ref >60)
Glucose, Bld: 256 mg/dL — ABNORMAL HIGH (ref 65–99)
Potassium: 4.9 mmol/L (ref 3.5–5.1)
SODIUM: 138 mmol/L (ref 135–145)
TOTAL PROTEIN: 7.8 g/dL (ref 6.5–8.1)

## 2018-04-02 LAB — CBC WITH DIFFERENTIAL/PLATELET
Basophils Absolute: 0 10*3/uL (ref 0.0–0.1)
Basophils Relative: 0 %
EOS ABS: 0.2 10*3/uL (ref 0.0–0.7)
Eosinophils Relative: 2 %
HCT: 40.5 % (ref 39.0–52.0)
Hemoglobin: 14.2 g/dL (ref 13.0–17.0)
Lymphocytes Relative: 10 %
Lymphs Abs: 1.1 10*3/uL (ref 0.7–4.0)
MCH: 29.5 pg (ref 26.0–34.0)
MCHC: 35.1 g/dL (ref 30.0–36.0)
MCV: 84 fL (ref 78.0–100.0)
MONO ABS: 0.5 10*3/uL (ref 0.1–1.0)
Monocytes Relative: 5 %
NEUTROS PCT: 83 %
Neutro Abs: 8.9 10*3/uL — ABNORMAL HIGH (ref 1.7–7.7)
PLATELETS: ADEQUATE 10*3/uL (ref 150–400)
RBC: 4.82 MIL/uL (ref 4.22–5.81)
RDW: 12.1 % (ref 11.5–15.5)
WBC: 10.7 10*3/uL — AB (ref 4.0–10.5)

## 2018-04-02 LAB — URINALYSIS, ROUTINE W REFLEX MICROSCOPIC
BILIRUBIN URINE: NEGATIVE
GLUCOSE, UA: 100 mg/dL — AB
Ketones, ur: 15 mg/dL — AB
Leukocytes, UA: NEGATIVE
Nitrite: NEGATIVE
Protein, ur: NEGATIVE mg/dL
SPECIFIC GRAVITY, URINE: 1.025 (ref 1.005–1.030)
pH: 6 (ref 5.0–8.0)

## 2018-04-02 LAB — URINALYSIS, MICROSCOPIC (REFLEX): BACTERIA UA: NONE SEEN

## 2018-04-02 LAB — LIPASE, BLOOD: LIPASE: 42 U/L (ref 11–51)

## 2018-04-02 MED ORDER — MORPHINE SULFATE (PF) 4 MG/ML IV SOLN
4.0000 mg | Freq: Once | INTRAVENOUS | Status: AC
  Administered 2018-04-02: 4 mg via INTRAVENOUS
  Filled 2018-04-02: qty 1

## 2018-04-02 MED ORDER — TAMSULOSIN HCL 0.4 MG PO CAPS: 0.4000 mg | ORAL_CAPSULE | Freq: Every day | ORAL | 0 refills | Status: DC

## 2018-04-02 MED ORDER — OXYCODONE-ACETAMINOPHEN 5-325 MG PO TABS: 1.0000 | ORAL_TABLET | ORAL | 0 refills | Status: DC | PRN

## 2018-04-02 MED ORDER — KETOROLAC TROMETHAMINE 30 MG/ML IJ SOLN
30.0000 mg | Freq: Once | INTRAMUSCULAR | Status: AC
  Administered 2018-04-02: 30 mg via INTRAVENOUS
  Filled 2018-04-02: qty 1

## 2018-04-02 MED ORDER — ONDANSETRON HCL 4 MG PO TABS: 4.0000 mg | ORAL_TABLET | Freq: Four times a day (QID) | ORAL | 0 refills | Status: DC | PRN

## 2018-04-02 MED ORDER — ONDANSETRON HCL 4 MG/2ML IJ SOLN
4.0000 mg | Freq: Once | INTRAMUSCULAR | Status: AC
  Administered 2018-04-02: 4 mg via INTRAVENOUS
  Filled 2018-04-02: qty 2

## 2018-04-02 MED ORDER — SODIUM CHLORIDE 0.9 % IV BOLUS
1000.0000 mL | Freq: Once | INTRAVENOUS | Status: AC
  Administered 2018-04-02: 1000 mL via INTRAVENOUS

## 2018-04-02 NOTE — ED Provider Notes (Signed)
[facility] DEPARTMENT Provider Note   CSN: [id number] Arrival date & time: 04/02/18  0409     History   Chief Complaint Chief Complaint  Patient presents with  . Flank Pain    HPI Christian Yang is a 57 y.o. male.  The history is provided by the patient.  He has a history of diabetes and possible history of kidney stone.  He had onset about midnight of right lower quadrant pain with radiation to the back.  Pain is both sharp and dull and severe.  He rates it at 10/10.  There is associated nausea and vomiting.  He denies fever, chills, sweats.  He denies any urinary difficulty.  He states prior kidney stone was diagnosed at an urgent care center and he did not have any imaging done, but does remember passing his stone.  He has not done anything to treat this pain.  Past Medical History:  Diagnosis Date  . Diabetes mellitus without complication [facility])     Patient Active Problem List   Diagnosis Date Noted  . Diabetes mellitus without complication ([place]) 01/77/9390  . OBESITY 03/12/2008  . ESOPHAGEAL MOTILITY DISORDER 03/12/2008  . GASTROESOPHAGEAL REFLUX DISEASE 03/12/2008    No past surgical history on file.      Home Medications    Prior to Admission medications   Medication Sig Start Date End Date Taking? Authorizing Provider  aspirin 81 MG tablet Take 81 mg by mouth daily.    [provider]  Blood Glucose Monitoring Suppl (ONETOUCH VERIO) w/Device KIT USE TO CHECK BLOOD SUGAR 3 TIMES DAILY 07/14/17   Elayne Snare, MD  Continuous Blood Gluc Sensor (FREESTYLE LIBRE 14 DAY SENSOR) MISC 1 each by Does not apply route every 14 (fourteen) days. 12/25/17   Elayne Snare, MD  glucose blood (ONETOUCH VERIO) test strip USE TO CHECK BLOOD SUGAR 3 TIMES DAILY 03/12/18   Elayne Snare, MD  insulin aspart (NOVOLOG) 100 UNIT/ML injection USE MAX 56 UNITS WITH V-GO PUMP 03/15/18   Elayne Snare, MD  Insulin Disposable Pump (V-GO 20) KIT USE ONE PER DAY AS DIRECTED  02/14/18   Elayne Snare, MD  insulin lispro (HUMALOG) 100 UNIT/ML injection USE MAX 56 UNITS PER DAY WITH V-GO PUMP Patient not taking: Reported on 11/30/2017 10/04/17   Elayne Snare, MD  metFORMIN (GLUCOPHAGE) 1000 MG tablet TAKE ONE TABLET BY MOUTH TWICE DAILY WITH MEALS 11/15/17   Elayne Snare, MD  omeprazole (PRILOSEC) 40 MG capsule Take 40 mg by mouth daily.    [provider]  [facility] DELICA LANCETS 30S MISC Use to check blood sugar 3 times per day. 05/25/16   Elayne Snare, MD  rosuvastatin (CRESTOR) 5 MG tablet TAKE 1 TABLET BY MOUTH EVERY DAY 12/18/17   Elayne Snare, MD    Family History Family History  Problem Relation Age of Onset  . Heart disease Mother   . Diabetes Mother   . Diabetes Sister   . Diabetes Maternal Grandmother     Social History Social History   Tobacco Use  . Smoking status: Never Smoker  . Smokeless tobacco: Never Used  Substance Use Topics  . Alcohol use: No    Alcohol/week: 0.0 oz  . Drug use: Not on file     Allergies   Penicillins   Review of Systems Review of Systems  All other systems reviewed and are negative.    Physical Exam Updated Vital Signs BP (!) 207/113   Pulse 64   Temp  97.8 F (36.6 C) (Oral)   Resp 20   Ht '6\' 1"'  (1.854 m)   Wt 99.8 kg (220 lb)   SpO2 100%   BMI 29.03 kg/m   Physical Exam  Nursing note and vitals reviewed.  57 year old male, resting comfortably and in no acute distress. Vital signs are significant for elevated blood pressure. Oxygen saturation is 100%, which is normal. Head is normocephalic and atraumatic. PERRLA, EOMI. Oropharynx is clear. Neck is nontender and supple without adenopathy or JVD. Back is nontender in the midline.  There is mild right CVA tenderness. Lungs are clear without rales, wheezes, or rhonchi. Chest is nontender. Heart has regular rate and rhythm without murmur. Abdomen is soft, flat, nontender without masses or hepatosplenomegaly and peristalsis is  hypoactive. Extremities have no cyanosis or edema, full range of motion is present. Skin is warm and dry without rash.  Color is generally pale. Neurologic: Mental status is normal, cranial nerves are intact, there are no motor or sensory deficits.  ED Treatments / Results  Labs (all labs ordered are listed, but only abnormal results are displayed) Labs Reviewed  URINALYSIS, ROUTINE W REFLEX MICROSCOPIC - Abnormal; Notable for the following components:      Result Value   Glucose, UA 100 (*)    Hgb urine dipstick MODERATE (*)    Ketones, ur 15 (*)    All other components within normal limits  COMPREHENSIVE METABOLIC PANEL - Abnormal; Notable for the following components:   Glucose, Bld 256 (*)    Creatinine, Ser 1.42 (*)    GFR calc non Af Amer 55 (*)    All other components within normal limits  CBC WITH DIFFERENTIAL/PLATELET - Abnormal; Notable for the following components:   WBC 10.7 (*)    Neutro Abs 8.9 (*)    All other components within normal limits  LIPASE, BLOOD  URINALYSIS, MICROSCOPIC (REFLEX)   Radiology Ct Renal Stone Study  Result Date: 04/02/2018 CLINICAL DATA:  Initial evaluation for acute right flank pain. EXAM: CT ABDOMEN AND PELVIS WITHOUT CONTRAST TECHNIQUE: Multidetector CT imaging of the abdomen and pelvis was performed following the standard protocol without IV contrast. COMPARISON:  None. FINDINGS: Lower chest: Visualized lung bases are clear. Scattered coronary artery calcifications noted. Hepatobiliary: Limited noncontrast evaluation of the liver is unremarkable. Gallbladder within normal limits. No biliary dilatation. Pancreas: Pancreas within normal limits. Spleen: Spleen within normal limits. Adrenals/Urinary Tract: Adrenal glands are normal. There is an obstructive 7 mm stone positioned just distal to the right UPJ with secondary mild right hydronephrosis. No other radiopaque calculi seen along the course of the right ureter. Additional nonobstructive  calculi present within the right kidney, largest of which measures 4 mm at the lower pole. Scattered nonobstructive calculi seen within the left kidney, largest of which positioned at the upper pole and measures 6 mm. 2.8 cm cyst present at the lower pole of the left kidney. No radiopaque calculi seen along the course of the left renal collecting system. No left-sided hydronephrosis or hydroureter. Bladder largely decompressed without acute abnormality. No layering stones within the bladder lumen. Stomach/Bowel: Stomach within normal limits. No evidence for bowel obstruction. Appendix normal. No acute inflammatory changes about the bowels. Probable ingested pills noted within the cecum. Vascular/Lymphatic: Mild aorto bi-iliac atherosclerotic disease. No aneurysm. More prominent atherosclerotic change noted within the proximal femoral vessels bilaterally. No adenopathy. Reproductive: Prostate within normal limits. Other: Small fat containing paraumbilical hernia noted. No free air or fluid. Musculoskeletal: No acute  osseus abnormality. No worrisome lytic or blastic osseous lesions. Prominent facet arthropathy noted within the lower lumbar spine. 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 IMPRESSION: 1. Obstructive 7 mm stone within the proximal right ureter with secondary mild right hydronephrosis. 2. Additional bilateral nonobstructive nephrolithiasis as above. 3. 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 Electronically Signed   By: Jeannine Boga M.D.   On: 04/02/2018 05:23    Procedures Procedures   Medications Ordered in ED Medications  ketorolac (TORADOL) 30 MG/ML injection 30 mg (30 mg Intravenous Given 04/02/18 0447)  sodium chloride 0.9 % bolus 1,000 mL (1,000 mLs Intravenous New Bag/Given 04/02/18 0446)  ondansetron (ZOFRAN) injection 4 mg (4 mg Intravenous Given 04/02/18 0446)  morphine 4 MG/ML injection 4 mg (4 mg  Intravenous Given 04/02/18 0447)  morphine 4 MG/ML injection 4 mg (4 mg Intravenous Given 04/02/18 0520)     Initial Impression / Assessment and Plan / ED Course  I have reviewed the triage vital signs and the nursing notes.  Pertinent labs & imaging results that were available during my care of the patient were reviewed by me and considered in my medical decision making (see chart for details).  Right-sided abdominal and flank pain worrisome for urolithiasis.  Consider urinary tract infection, pyelonephritis, appendicitis, cholecystitis, pancreatitis, diverticulitis.  Old records are reviewed, and he has no relevant past visits, no prior abdominal imaging.  He will be given IV fluids, ketorolac, morphine, ondansetron and sent for renal stone protocol CT scan.  He got good relief with above-noted treatment, but would require a second dose of morphine for pain control.  CT scan shows 7 mm proximal right ureteral calculus, with residual renal calculi in both kidneys.  Labs are significant for mild elevation of creatinine.  This will need to be followed as an outpatient.  Blood pressure continues to be elevated, will need to be monitored as an outpatient.  He is discharged with prescriptions for oxycodone-acetaminophen, ondansetron, and tamsulosin.  He is referred to urology for follow-up.  Advised to avoid all NSAIDs and until decision is made regarding possible procedure to effect stone passage (eg lithotripsy).  Return precautions discussed.  Final Clinical Impressions(s) / ED Diagnoses   Final diagnoses:  Ureterolithiasis  Renal insufficiency  Elevated blood-pressure reading without diagnosis of hypertension    ED Discharge Orders        Ordered    oxyCODONE-acetaminophen (PERCOCET) 5-325 MG tablet  Every 4 hours PRN     04/02/18 0550    ondansetron (ZOFRAN) 4 MG tablet  Every 6 hours PRN     04/02/18 0550    tamsulosin (FLOMAX) 0.4 MG CAPS capsule  Daily     41/74/08 1448        Delora Fuel, MD 18/56/31 [phone]

## 2018-04-02 NOTE — ED Notes (Signed)
Pt given diet ginger ale.

## 2018-04-02 NOTE — ED Notes (Signed)
ED Provider at bedside. 

## 2018-04-02 NOTE — ED Notes (Signed)
Pt returned from CT °

## 2018-04-02 NOTE — ED Triage Notes (Signed)
Pt states that since approx midnight he has experienced right sided abdominal paint that is traveling to his back. States it has been fairly constant and sharp in nature. Endorses Nausea/vomiting. Denies fevers or diarrhea.

## 2018-04-02 NOTE — ED Notes (Signed)
Patient transported to CT 

## 2018-04-02 NOTE — Discharge Instructions (Addendum)
Return if pain is not being adequately controlled, you are vomiting in spite of the medication, or if you start running a fever.  Your creatinine (a blood test of your kidney function) was a little higher today than it had been. This will need to be repeated after the stone passes.  Your blood pressure today was high. Please have it checked several times after the stone passes. If your blood pressure stays high, you may need to start taking medication for it.

## 2018-04-04 ENCOUNTER — Encounter (HOSPITAL_COMMUNITY): Payer: Self-pay | Admitting: General Practice

## 2018-04-08 ENCOUNTER — Ambulatory Visit (HOSPITAL_COMMUNITY)
Admission: RE | Admit: 2018-04-08 | Discharge: 2018-04-08 | Disposition: A | Discharge status: Home / Self Care | Payer: BLUE CROSS/BLUE SHIELD | Source: Ambulatory Visit | Attending: Urology | Admitting: Urology

## 2018-04-08 ENCOUNTER — Encounter (HOSPITAL_COMMUNITY)
Admission: RE | Disposition: A | Discharge status: Home / Self Care | Payer: Self-pay | Source: Ambulatory Visit | Attending: Urology

## 2018-04-08 ENCOUNTER — Encounter (HOSPITAL_COMMUNITY): Payer: Self-pay | Admitting: General Practice

## 2018-04-08 ENCOUNTER — Ambulatory Visit (HOSPITAL_COMMUNITY): Payer: BLUE CROSS/BLUE SHIELD

## 2018-04-08 DIAGNOSIS — E785 Hyperlipidemia, unspecified: Secondary | ICD-10-CM | POA: Diagnosis not present

## 2018-04-08 DIAGNOSIS — I10 Essential (primary) hypertension: Secondary | ICD-10-CM | POA: Diagnosis not present

## 2018-04-08 DIAGNOSIS — Z7982 Long term (current) use of aspirin: Secondary | ICD-10-CM | POA: Insufficient documentation

## 2018-04-08 DIAGNOSIS — N201 Calculus of ureter: Secondary | ICD-10-CM | POA: Diagnosis not present

## 2018-04-08 DIAGNOSIS — E119 Type 2 diabetes mellitus without complications: Secondary | ICD-10-CM | POA: Insufficient documentation

## 2018-04-08 DIAGNOSIS — Z79899 Other long term (current) drug therapy: Secondary | ICD-10-CM | POA: Diagnosis not present

## 2018-04-08 DIAGNOSIS — Z7984 Long term (current) use of oral hypoglycemic drugs: Secondary | ICD-10-CM | POA: Diagnosis not present

## 2018-04-08 HISTORY — PX: EXTRACORPOREAL SHOCK WAVE LITHOTRIPSY: SHX1557

## 2018-04-08 HISTORY — DX: Personal history of urinary calculi: Z87.442

## 2018-04-08 HISTORY — DX: Pure hypercholesterolemia, unspecified: E78.00

## 2018-04-08 HISTORY — DX: Gastro-esophageal reflux disease without esophagitis: K21.9

## 2018-04-08 LAB — GLUCOSE, CAPILLARY: Glucose-Capillary: 282 mg/dL — ABNORMAL HIGH (ref 65–99)

## 2018-04-08 SURGERY — LITHOTRIPSY, ESWL
Anesthesia: LOCAL | Laterality: Right

## 2018-04-08 MED ORDER — DIAZEPAM 5 MG PO TABS
10.0000 mg | ORAL_TABLET | ORAL | Status: AC
  Administered 2018-04-08: 10 mg via ORAL
  Filled 2018-04-08: qty 2

## 2018-04-08 MED ORDER — SODIUM CHLORIDE 0.9 % IV SOLN
INTRAVENOUS | Status: DC
  Administered 2018-04-08: 15:00:00 via INTRAVENOUS

## 2018-04-08 MED ORDER — CIPROFLOXACIN HCL 500 MG PO TABS
500.0000 mg | ORAL_TABLET | ORAL | Status: AC
  Administered 2018-04-08: 500 mg via ORAL
  Filled 2018-04-08: qty 1

## 2018-04-08 MED ORDER — DIPHENHYDRAMINE HCL 25 MG PO CAPS
25.0000 mg | ORAL_CAPSULE | ORAL | Status: AC
  Administered 2018-04-08: 25 mg via ORAL
  Filled 2018-04-08: qty 1

## 2018-04-09 ENCOUNTER — Encounter (HOSPITAL_COMMUNITY): Payer: Self-pay | Admitting: Urology

## 2018-04-11 ENCOUNTER — Other Ambulatory Visit: Payer: Self-pay | Admitting: Endocrinology

## 2018-04-22 DIAGNOSIS — N201 Calculus of ureter: Secondary | ICD-10-CM | POA: Diagnosis not present

## 2018-05-08 ENCOUNTER — Other Ambulatory Visit: Payer: Self-pay | Admitting: Endocrinology

## 2018-05-09 ENCOUNTER — Telehealth: Payer: Self-pay | Admitting: Endocrinology

## 2018-05-09 NOTE — Telephone Encounter (Signed)
Per [facility]-Caller states her husband is a patient and wants to figure out who carries V Go nbecause her pharmacy quit carrying them

## 2018-05-09 NOTE — Telephone Encounter (Signed)
Attempted to call pt but he did not answer. Left voicemail for pt to call back.

## 2018-05-15 ENCOUNTER — Telehealth: Payer: Self-pay

## 2018-05-15 NOTE — Telephone Encounter (Signed)
PA submitted for V-Go 20 kit via [email or web address] Key:[id number].

## 2018-05-24 ENCOUNTER — Ambulatory Visit: Payer: BLUE CROSS/BLUE SHIELD | Admitting: Endocrinology

## 2018-05-24 ENCOUNTER — Encounter: Payer: Self-pay | Admitting: Endocrinology

## 2018-05-24 VITALS — BP 140/78 | HR 78 | Ht 73.0 in | Wt 209.6 lb

## 2018-05-24 DIAGNOSIS — E1165 Type 2 diabetes mellitus with hyperglycemia: Secondary | ICD-10-CM

## 2018-05-24 DIAGNOSIS — N289 Disorder of kidney and ureter, unspecified: Secondary | ICD-10-CM | POA: Diagnosis not present

## 2018-05-24 DIAGNOSIS — Z794 Long term (current) use of insulin: Secondary | ICD-10-CM

## 2018-05-24 LAB — COMPREHENSIVE METABOLIC PANEL
ALBUMIN: 4.3 g/dL (ref 3.5–5.2)
ALT: 13 U/L (ref 0–53)
AST: 11 U/L (ref 0–37)
Alkaline Phosphatase: 55 U/L (ref 39–117)
BILIRUBIN TOTAL: 0.5 mg/dL (ref 0.2–1.2)
BUN: 20 mg/dL (ref 6–23)
CALCIUM: 9.4 mg/dL (ref 8.4–10.5)
CO2: 27 mEq/L (ref 19–32)
CREATININE: 1.32 mg/dL (ref 0.40–1.50)
Chloride: 102 mEq/L (ref 96–112)
GFR: 59.86 mL/min — ABNORMAL LOW (ref >60.00)
Glucose, Bld: 383 mg/dL — ABNORMAL HIGH (ref 70–99)
Potassium: 4.3 mEq/L (ref 3.5–5.1)
SODIUM: 137 meq/L (ref 135–145)
TOTAL PROTEIN: 6.9 g/dL (ref 6.0–8.3)

## 2018-05-24 LAB — URINALYSIS, ROUTINE W REFLEX MICROSCOPIC
Bilirubin Urine: NEGATIVE
Hgb urine dipstick: NEGATIVE
KETONES UR: NEGATIVE
Leukocytes, UA: NEGATIVE
Nitrite: NEGATIVE
PH: 5.5 (ref 5.0–8.0)
RBC / HPF: NONE SEEN (ref 0–?)
SPECIFIC GRAVITY, URINE: 1.02 (ref 1.000–1.030)
TOTAL PROTEIN, URINE-UPE24: NEGATIVE
Urobilinogen, UA: 0.2 (ref 0.0–1.0)

## 2018-05-24 LAB — LIPID PANEL
Cholesterol: 117 mg/dL (ref 0–200)
HDL: 53.4 mg/dL (ref >39.00)
LDL Cholesterol: 46 mg/dL (ref 0–99)
NonHDL: 63.32
Total CHOL/HDL Ratio: 2
Triglycerides: 85 mg/dL (ref 0.0–149.0)
VLDL: 17 mg/dL (ref 0.0–40.0)

## 2018-05-24 LAB — POCT GLYCOSYLATED HEMOGLOBIN (HGB A1C): Hemoglobin A1C: 8 % — AB (ref 4.0–5.6)

## 2018-05-24 LAB — MICROALBUMIN / CREATININE URINE RATIO
Creatinine,U: 87.4 mg/dL
Microalb Creat Ratio: 0.8 mg/g (ref 0.0–30.0)
Microalb, Ur: <0.7 mg/dL (ref 0.0–1.9)

## 2018-05-24 NOTE — Progress Notes (Signed)
Patient ID: Christian Yang, male   DOB: 1961-10-10, 57 y.o.   MRN: [id number]           Reason for Appointment: Follow-up for Type 2 Diabetes  Referring physician: Olen Pel   History of Present Illness:          Date of diagnosis of type 2 diabetes mellitus:   2008       Background history:   He apparently had a weight of 285 pounds at diagnosis He was having dry mouth and flulike symptoms at onset and glucose was 672 He was started on metformin is still taking Not clear what other medications he has been taking but he has been on Jardiance for 2 years and Bydureon since 6/17 Records indicated that his A1c has been 10-11+ since at least 2015 Levemir insulin was started 3-4 years prior to his consultation but without adequate control   He was started on the V-go pump in 04/2015  Recent history:   INSULIN regimen is:  V-go pump 20.  Boluses usually 4-4-4/6 units at meals        Non-insulin hypoglycemic drugs the patient is taking are: metformin 1 g twice a day   His A1c is consistently over 7%, now 8%  Current management, blood sugar patterns and problems identified:  He has been using the FreeStyle libre CGM consistently  Again although he has fairly good blood sugars on an average he has continued higher postprandial readings which is most often after breakfast and also at times after lunch and occasionally after dinner  Since his blood sugars before his meals at least in the morning and sometimes at lunchtime are fairly good, low or low normal he may not take more than 2 units bolus before eating and the rest later if the sugar is high; sometimes may not bolus until after eating also which causes high readings  Probably has a dawn phenomenon also with blood sugars rising early morning, also he does not bolus before he has coffee in the morning  Also has hypoglycemia is occurring most often around 4 AM, before lunch and rarely before suppertime, not clear if his meter is  programmed to the right time.  He thinks that sometimes when his blood sugars read low on his sensor his fingerstick may not be as low  Recently he is having been consistently high readings after lunch and occasionally after evening meal also indicating inadequate boluses  Fasting readings when he first is checking his blood sugar can be variable and recently may be hypoglycemic overnight at times  He is changing his V-go pump consistently at the same time   Side effects from diabetes medications have been: None  Hypoglycemia:   as above  Glucose monitoring:       Glucometer:  Freestyle Libre  recently    Blood Glucose readings by time of day reviewed from download    CGM use % of time  92  Average and SD  157+/-57  Time in range       59 %, was 61  % Time Above 180  35  % Time above 250  7  % Time Below target  6   Mean values apply above for all meters except median for One Touch  PRE-MEAL Fasting Lunch Dinner Bedtime Overall  Glucose range:       Mean/median:  125  113  170  159  157   POST-MEAL PC Breakfast PC Lunch PC Dinner  Glucose  range:     Mean/median:  194  193  184   Previous average 151   Self-care: The diet that the patient has been following is: tries to limit High-fat foods .     Typical meal intake: Breakfast is cereal, sometimes oatmeal or biscuits, lunch is a sandwich and fruit. Lunch 1 pm Dinner is baked chicken, salad and rice at 7 pm .   Snacks are on Peanut butter and crackers, having this at 10. 30 a.m. at work                Dietician visit, most recent: At diagnosis only CDE visit: 7/17               Exercise: On his feet at work.    Weight history: Maximum 285  Wt Readings from Last 3 Encounters:  05/24/18 209 lb 9.6 oz (95.1 kg)  04/08/18 212 lb 2 oz (96.2 kg)  04/02/18 220 lb (99.8 kg)    Glycemic control:   Lab Results  Component Value Date   HGBA1C 8.0 (A) 05/24/2018   HGBA1C 7.3 08/24/2017   HGBA1C 7.5 05/24/2017   Lab  Results  Component Value Date   MICROALBUR 1.5 05/24/2017   LDLCALC 54 11/30/2017   CREATININE 1.42 (H) 04/02/2018   Lab Results  Component Value Date   MICRALBCREAT 0.7 05/24/2017       Allergies as of 05/24/2018      Reactions   Penicillins Rash      Medication List        Accurate as of 05/24/18  4:12 PM. Always use your most recent med list.          FREESTYLE LIBRE 14 DAY SENSOR Misc 1 each by Does not apply route every 14 (fourteen) days.   glucose blood test strip Commonly known as:  ONETOUCH VERIO USE TO CHECK BLOOD SUGAR 3 TIMES DAILY   insulin aspart 100 UNIT/ML injection Commonly known as:  NOVOLOG USE MAX 56 UNITS WITH V-GO PUMP   metFORMIN 1000 MG tablet Commonly known as:  GLUCOPHAGE TAKE ONE TABLET BY MOUTH TWICE DAILY WITH MEALS   omeprazole 40 MG capsule Commonly known as:  PRILOSEC Take 40 mg by mouth daily.   ONETOUCH DELICA LANCETS 93J Misc Use to check blood sugar 3 times per day.   ONETOUCH VERIO w/Device Kit USE TO CHECK BLOOD SUGAR 3 TIMES DAILY   rosuvastatin 5 MG tablet Commonly known as:  CRESTOR TAKE 1 TABLET BY MOUTH EVERY DAY   tamsulosin 0.4 MG Caps capsule Commonly known as:  FLOMAX Take 1 capsule (0.4 mg total) by mouth daily.   V-GO 20 Kit USE ONE PER DAY AS DIRECTED       Allergies:  Allergies  Allergen Reactions  . Penicillins Rash    Past Medical History:  Diagnosis Date  . Diabetes mellitus without complication ([place])   . GERD (gastroesophageal reflux disease)   . High cholesterol   . History of kidney stones     Past Surgical History:  Procedure Laterality Date  . CARPAL TUNNEL RELEASE Bilateral    10 +years ago  . ESOPHAGOGASTRODUODENOSCOPY  2000   stretching of esophagus  . EXTRACORPOREAL SHOCK WAVE LITHOTRIPSY Right 04/08/2018   Procedure: RIGHT EXTRACORPOREAL SHOCK WAVE LITHOTRIPSY (ESWL);  Surgeon: Cleon Gustin, MD;  Location: [facility];  Service: Urology;  Laterality: Right;    Family  History  Problem Relation Age of Onset  . Heart disease Mother   .  Diabetes Mother   . Diabetes Sister   . Diabetes Maternal Grandmother     Social History:  reports that he has never smoked. He has never used smokeless tobacco. He reports that he does not drink alcohol or use drugs.   Review of Systems   Lipid history: LDL controlled with Crestor 5 mg    Lab Results  Component Value Date   CHOL 122 11/30/2017   HDL 52 11/30/2017   LDLCALC 54 11/30/2017   TRIG 78 11/30/2017   CHOLHDL 2.3 11/30/2017           Hypertension: Not on any treatment, blood pressure is sometimes higher in the office  Recently renal function abnormal, did have kidney stones  Lab Results  Component Value Date   CREATININE 1.42 (H) 04/02/2018   CREATININE 1.12 12/24/2017   CREATININE 1.04 11/30/2017      Most recent eye exam was 6/18  Most recent foot exam: 11/18   LABS:  Office Visit on 05/24/2018  Component Date Value Ref Range Status  . Hemoglobin A1C 05/24/2018 8.0* 4.0 - 5.6 % Final    Physical Examination:  BP 140/78 (BP Location: Left Arm, Patient Position: Sitting, Cuff Size: Normal)   Pulse 78   Ht '6\' 1"'  (1.854 m)   Wt 209 lb 9.6 oz (95.1 kg)   SpO2 97%   BMI 27.65 kg/m     ASSESSMENT:  Diabetes type 2, uncontrolled    See history of present illness for detailed discussion of current diabetes management, blood sugar patterns and problems identified  He has had long-standing diabetes, Requiring basal bolus insulin on the  V-go pump for at least 2 years Also on metformin  His A1c is still not adequate and higher at 8%  He has difficulty controlling postprandial readings and likely is not getting enough insulin for his meals Has blood sugar spikes most consistently after breakfast and frequently after lunch also However has a tendency to variable degrees of hypoglycemia early morning or before lunchtime This is related to his basal rate but also sometimes because  of his being active at work  Overall he is not taking enough insulin to cover his breakfast and lunch and may not take the full amount before eating because of low normal or low sugars Also has a dawn phenomenon and not covering some rise in blood sugar when he has coffee in the morning  Freestyle sensor readings are somewhat lower compared to fingersticks at times including in the low range  Renal dysfunction: Needs follow-up   LIPIDS: He has had hypercholesterolemia with diabetes and this is controlled with Crestor   PLAN for treatment:     He was given information on the Medtronic 670 pump and discussed how this would work and help regulate his blood sugars much better with reduce tendency to extreme blood sugars both high and low  He will look into this and insurance verification will be faxed  He does need to take at least 2 units more for breakfast and lunch regardless of pre-meal blood sugar and bolus before eating  Also increase bolus by 2 units when eating a larger meal in the evening  Midmorning snack especially if the blood sugar is relatively low around 10-11 AM  Reduce evening metformin to 500 mg which may reduce tendency to overnight hypoglycemia  Counseling time on subjects discussed in assessment and plan sections is over 50% of today's 25 minute visit      Patient Instructions  Reduce METFROMIN TO in pm to 1/2 tab   Take 1 click for am coffee and 2 for Breakfast  # of Clicks based on how much u are eating at suppertime  Try to do the clicks right before eating consistently esp at lunch    Counseling time on subjects discussed in assessment and plan sections is over 50% of today's 25 minute visit   Elayne Snare 05/24/2018, 4:12 PM   Note: This office note was prepared with Dragon voice recognition system technology. Any transcriptional errors that result from this process are unintentional.

## 2018-05-24 NOTE — Patient Instructions (Addendum)
Reduce METFROMIN TO in pm to 1/2 tab   Take 1 click for am coffee and 2 for Breakfast  # of Clicks based on how much u are eating at suppertime  Try to do the clicks right before eating consistently esp at lunch

## 2018-05-28 ENCOUNTER — Telehealth: Payer: Self-pay | Admitting: Endocrinology

## 2018-05-28 NOTE — Telephone Encounter (Signed)
Spoke to wife because we have no HIPAA on file and can not disclose information to her. Called pt and had to LVM

## 2018-05-28 NOTE — Telephone Encounter (Signed)
Patient wife would like a call back with husbands lab results. please advise

## 2018-05-30 ENCOUNTER — Telehealth: Payer: Self-pay | Admitting: Endocrinology

## 2018-05-30 NOTE — Telephone Encounter (Signed)
Patient's wife and patient want to know if patient has been approved for new diabetes pump. Please call ph# [phone] to advise.

## 2018-05-30 NOTE — Telephone Encounter (Signed)
Paperwork has been faxed and currently waiting determination.

## 2018-06-07 DIAGNOSIS — Z7984 Long term (current) use of oral hypoglycemic drugs: Secondary | ICD-10-CM | POA: Diagnosis not present

## 2018-06-07 DIAGNOSIS — Z794 Long term (current) use of insulin: Secondary | ICD-10-CM | POA: Diagnosis not present

## 2018-06-07 DIAGNOSIS — E119 Type 2 diabetes mellitus without complications: Secondary | ICD-10-CM | POA: Diagnosis not present

## 2018-06-19 ENCOUNTER — Other Ambulatory Visit: Payer: Self-pay

## 2018-06-19 MED ORDER — V-GO 20 KIT: PACK | 3 refills | Status: DC

## 2018-06-25 ENCOUNTER — Other Ambulatory Visit: Payer: Self-pay | Admitting: Emergency Medicine

## 2018-06-25 ENCOUNTER — Telehealth: Payer: Self-pay | Admitting: Emergency Medicine

## 2018-06-25 MED ORDER — V-GO 20 KIT: PACK | 3 refills | Status: DC

## 2018-06-25 NOTE — Telephone Encounter (Signed)
Called and spoke to patient. Patient wanted prescription to go to [facility]. [address] [place]. Resent prescription to correct pharmacy. Nothing further needed at this time.

## 2018-06-25 NOTE — Telephone Encounter (Signed)
Pt called and requested a refill on his V-GO. Pharmacy is [facility] [address] ([address] Thanks.

## 2018-06-28 DIAGNOSIS — N200 Calculus of kidney: Secondary | ICD-10-CM | POA: Diagnosis not present

## 2018-07-21 IMAGING — CT CT RENAL STONE PROTOCOL
2 of 4 series · 16 of 46 positions shown, 18 images · non-contrast
Comparison: None.

CLINICAL DATA: Initial evaluation for acute right flank pain.

EXAM:
CT ABDOMEN AND PELVIS WITHOUT CONTRAST
TECHNIQUE: Multidetector CT imaging of the abdomen and pelvis was performed
following the standard protocol without IV contrast.

[Series 2: axial st · axial · 0.94mm/px · z∈[-526,-76]mm · 13 of 100 slices shown, 15 images]
[im 5/100  soft-tissue]
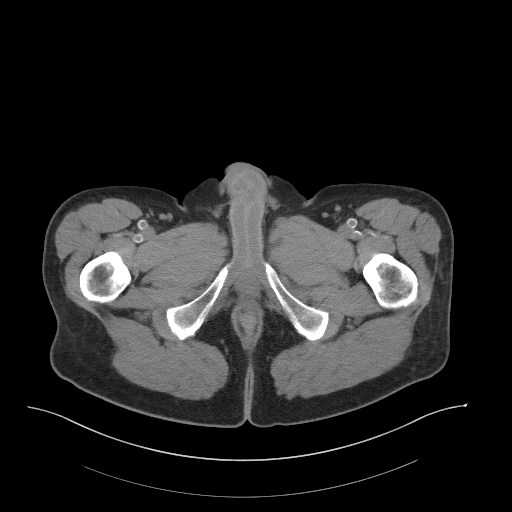
[im 5/100  bone]
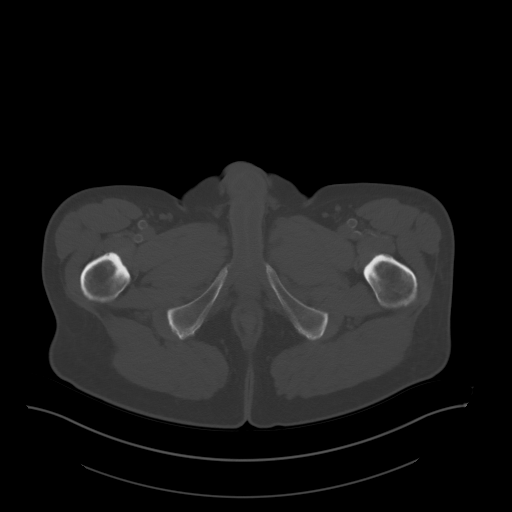
[im 13/100  soft-tissue]
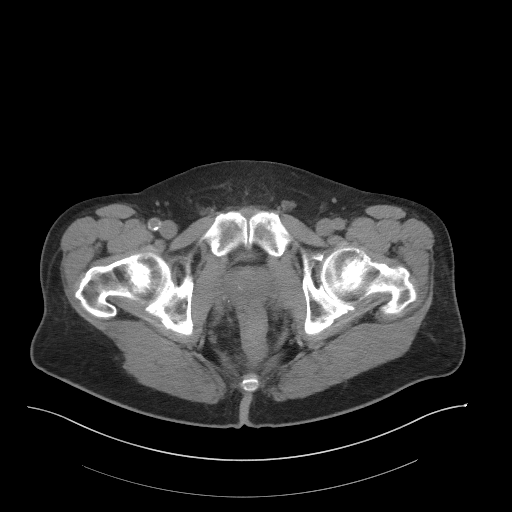
[im 22/100  soft-tissue]
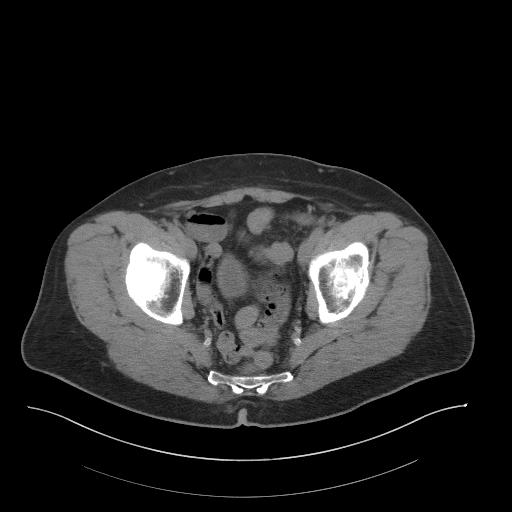
[im 26/100  soft-tissue]
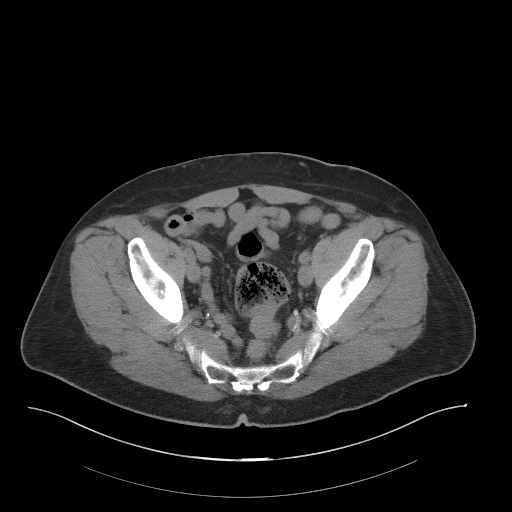
[im 35/100  soft-tissue]
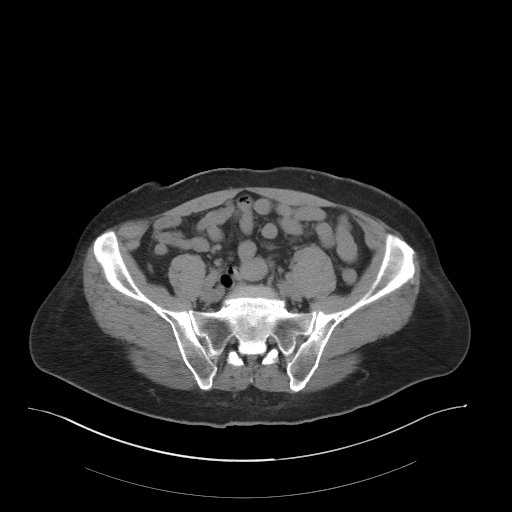
[im 44/100  soft-tissue]
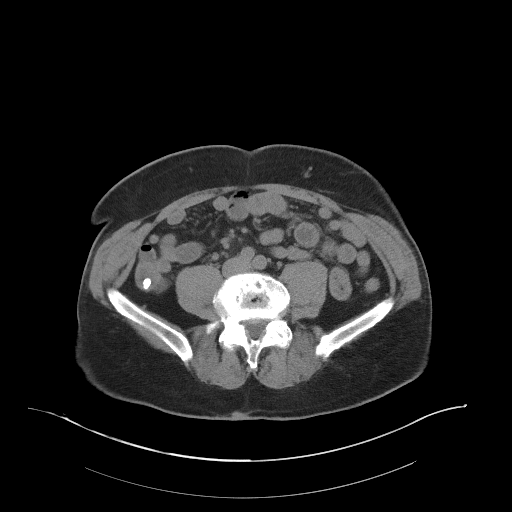
[im 52/100  soft-tissue]
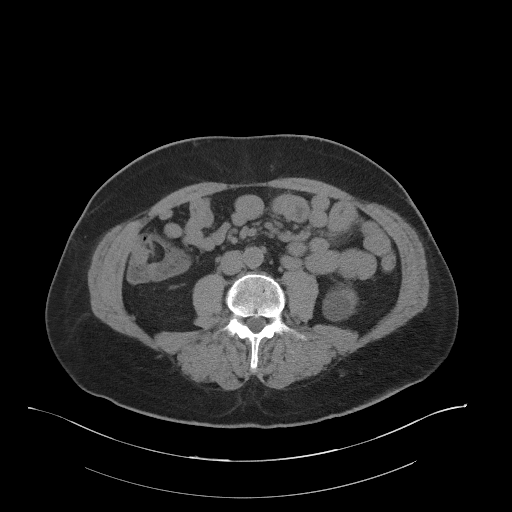
[im 56/100  soft-tissue]
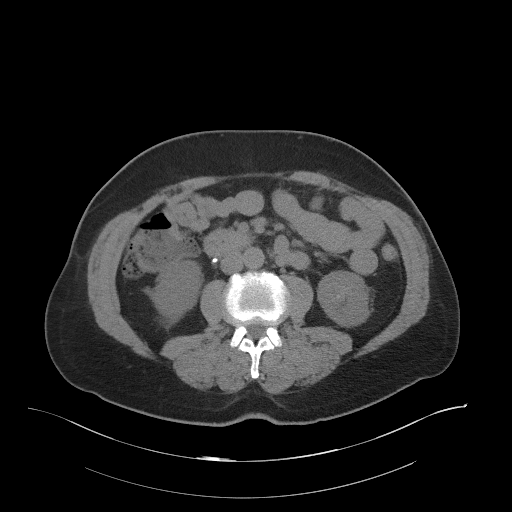
[im 65/100  soft-tissue]
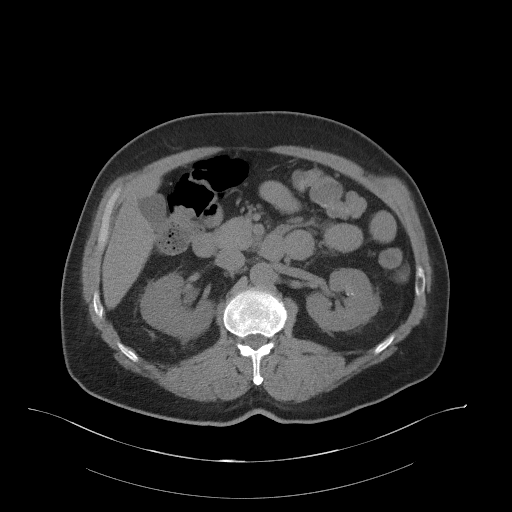
[im 65/100  bone]
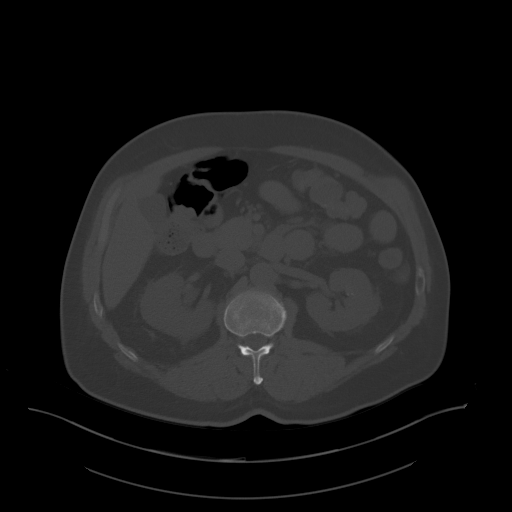
[im 74/100  soft-tissue]
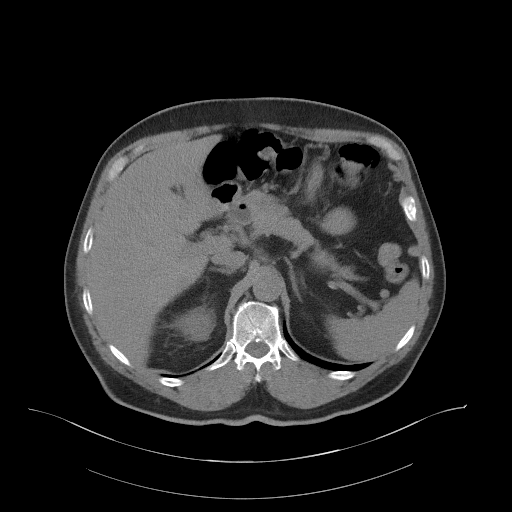
[im 78/100  soft-tissue]
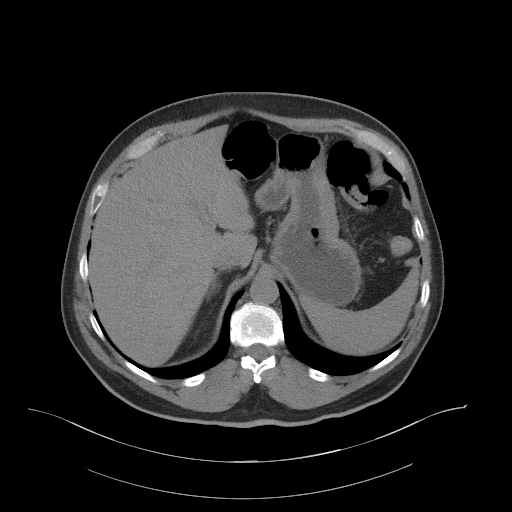
[im 87/100  soft-tissue]
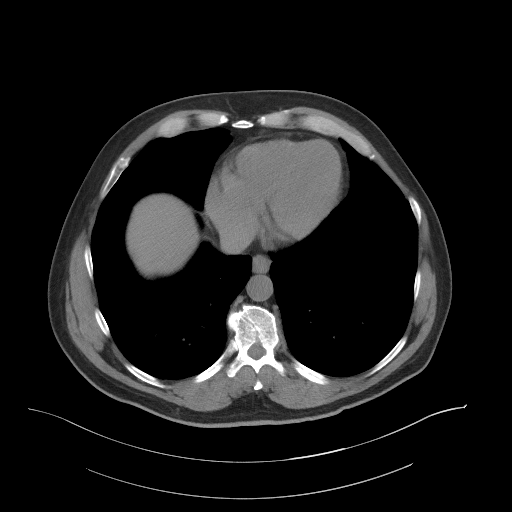
[im 95/100  soft-tissue]
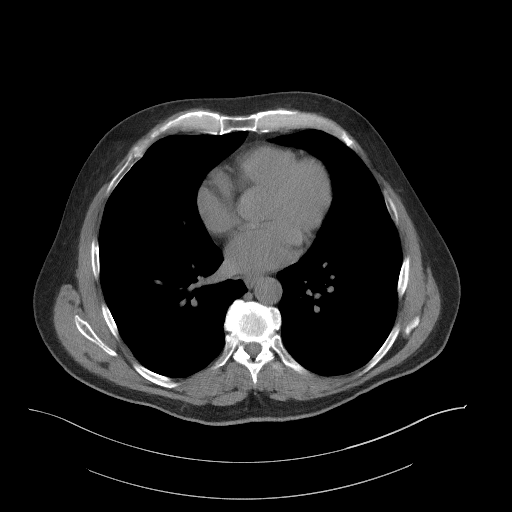

[Series 4: coronal st · coronal · 0.99mm/px · 3 of 93 slices shown]
[im 31/93  soft-tissue]
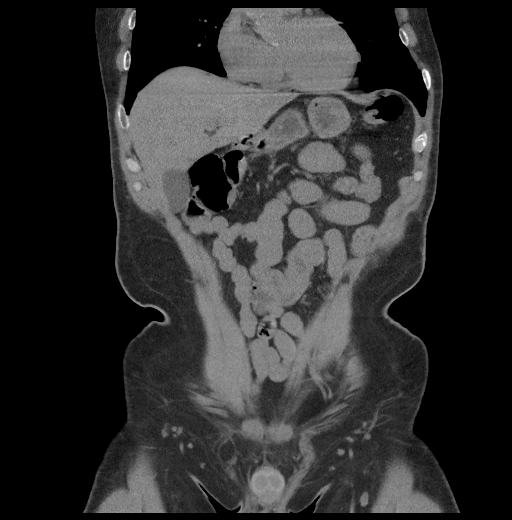
[im 41/93  soft-tissue]
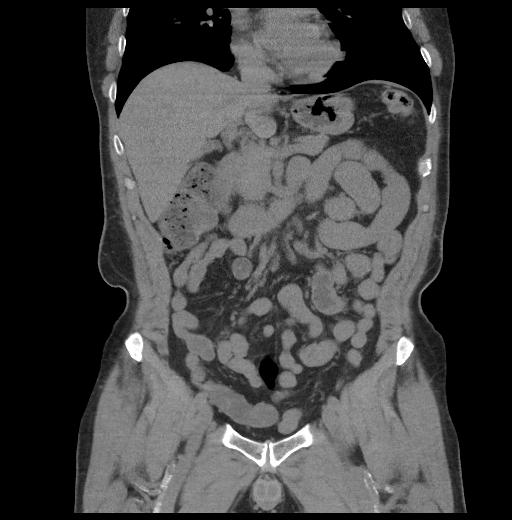
[im 52/93  soft-tissue]
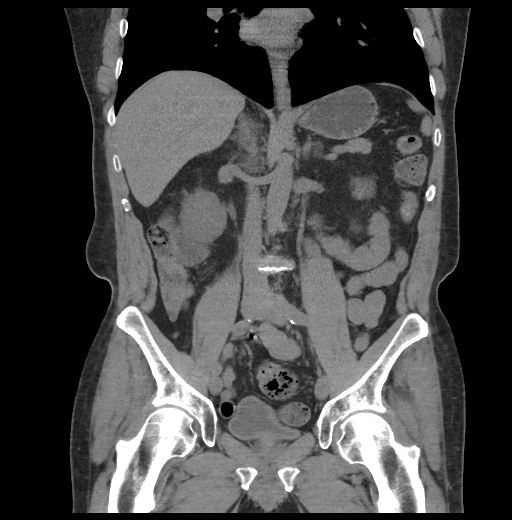

[16 of 46 positions shown; findings below may reference images not displayed]

FINDINGS: Lower chest: Visualized lung bases are clear. Scattered coronary
artery calcifications noted.

Hepatobiliary: Limited noncontrast evaluation of the liver is
unremarkable. Gallbladder within normal limits. No biliary
dilatation.

Pancreas: Pancreas within normal limits.

Spleen: Spleen within normal limits.

Adrenals/Urinary Tract: Adrenal glands are normal.

There is an obstructive 7 mm stone positioned just distal to the
right UPJ with secondary mild right hydronephrosis. No other
radiopaque calculi seen along the course of the right ureter.
Additional nonobstructive calculi present within the right kidney,
largest of which measures 4 mm at the lower pole. Scattered
nonobstructive calculi seen within the left kidney, largest of which
positioned at the upper pole and measures 6 mm. 2.8 cm cyst present
at the lower pole of the left kidney. No radiopaque calculi seen
along the course of the left renal collecting system. No left-sided
hydronephrosis or hydroureter. Bladder largely decompressed without
acute abnormality. No layering stones within the bladder lumen.

Stomach/Bowel: Stomach within normal limits. No evidence for bowel
obstruction. Appendix normal. No acute inflammatory changes about
the bowels. Probable ingested pills noted within the cecum.

Vascular/Lymphatic: Mild aorto bi-iliac atherosclerotic disease. No
aneurysm. More prominent atherosclerotic change noted within the
proximal femoral vessels bilaterally. No adenopathy.

Reproductive: Prostate within normal limits.

Other: Small fat containing paraumbilical hernia noted. No free air
or fluid.

Musculoskeletal: No acute osseus abnormality. No worrisome lytic or
blastic osseous lesions. Prominent facet arthropathy noted within
the lower lumbar spine. Chronic left-sided pars defect at L5 with
associated 4 mm spondylolisthesis.
IMPRESSION: 1. Obstructive 7 mm stone within the proximal right ureter with
secondary mild right hydronephrosis.
2. Additional bilateral nonobstructive nephrolithiasis as above.
3. Chronic unilateral left-sided pars defect at L5 with associated 4
mm spondylolisthesis.

## 2018-07-22 ENCOUNTER — Telehealth: Payer: Self-pay | Admitting: Endocrinology

## 2018-07-22 NOTE — Telephone Encounter (Signed)
Pt needs vgo called into [facility] in [place] [phone] Pt did call pharmacy they asked him to call us

## 2018-07-23 NOTE — Telephone Encounter (Signed)
90 day supply with 3 refills sent to the [facility] in [place] on 06/25/18. Called pt to discuss and unable to LVM

## 2018-07-24 ENCOUNTER — Other Ambulatory Visit: Payer: Self-pay | Admitting: Endocrinology

## 2018-07-31 ENCOUNTER — Other Ambulatory Visit: Payer: Self-pay | Admitting: Endocrinology

## 2018-08-30 ENCOUNTER — Encounter: Payer: Self-pay | Admitting: Endocrinology

## 2018-08-30 ENCOUNTER — Ambulatory Visit: Payer: BLUE CROSS/BLUE SHIELD | Admitting: Endocrinology

## 2018-08-30 VITALS — BP 134/80 | HR 84 | Resp 16 | Ht 73.0 in | Wt 210.0 lb

## 2018-08-30 DIAGNOSIS — E119 Type 2 diabetes mellitus without complications: Secondary | ICD-10-CM | POA: Diagnosis not present

## 2018-08-30 DIAGNOSIS — E1165 Type 2 diabetes mellitus with hyperglycemia: Secondary | ICD-10-CM | POA: Diagnosis not present

## 2018-08-30 DIAGNOSIS — Z794 Long term (current) use of insulin: Secondary | ICD-10-CM

## 2018-08-30 LAB — POCT GLYCOSYLATED HEMOGLOBIN (HGB A1C): HEMOGLOBIN A1C: 7.6 % — AB (ref 4.0–5.6)

## 2018-08-30 NOTE — Patient Instructions (Addendum)
Add 1 more click at supper  Click 3 x on  Waking up with coffee  Glucose tabs for lows

## 2018-08-30 NOTE — Progress Notes (Signed)
Patient ID: Christian Yang, male   DOB: April 11, 1961, 57 y.o.   MRN: [id number]           Reason for Appointment: Follow-up for Type 2 Diabetes  Referring physician: Olen Pel   History of Present Illness:          Date of diagnosis of type 2 diabetes mellitus:   2008       Background history:   He apparently had a weight of 285 pounds at diagnosis He was having dry mouth and flulike symptoms at onset and glucose was 672 He was started on metformin is still taking Not clear what other medications he has been taking but he has been on Jardiance for 2 years and Bydureon since 6/17 Records indicated that his A1c has been 10-11+ since at least 2015 Levemir insulin was started 3-4 years prior to his consultation but without adequate control   He was started on the V-go pump in 04/2015  Recent history:   INSULIN regimen is:  V-go pump 20.  Boluses usually 4-4-4/6 units at meals        Non-insulin hypoglycemic drugs the patient is taking are: metformin 1 g twice a day   His A1c is consistently over 7%, now 7.6 compared to 8%  Current management, blood sugar patterns and problems identified:  Blood sugar patterns and management are discussed below  He is still having erratic blood sugars overall but less hypoglycemia than before  His boluses at dinnertime are based on what the blood sugar is but not based on what he is eating and may not adequately cover all meals  He still has not obtained the Medtronic pump partly because of high out-of-pocket expense when last checked   Side effects from diabetes medications have been: None  Hypoglycemia:   as above  Glucose monitoring:       Glucometer:  Freestyle Libre     Blood Glucose analysis:   CONTINUOUS GLUCOSE MONITORING RECORD INTERPRETATION    Dates of Recording: 10/26 to 08/30/2018  Sensor description: [employer]  Results statistics:   CGM use % of time  96  Average and SD 171  Time in range       56 %, was 59  %  Time Above 180  41  % Time above 250  12  % Time Below target  3, was 6    Glycemic patterns summary: Blood sugars are showing fair amount of variability throughout the day HIGHEST blood sugars are after breakfast and evening meal but are in the target range only for part of the night and middle of the day Hypoglycemia tendency has been less frequent than on his previous visit and occurring on 2 nights He also thinks that occasionally his freestyle sensor may read differently than his fingerstick which he does not do much  Hyperglycemic episodes have been occurring fairly regularly in the mornings even before breakfast and also fairly regularly in the evenings after supper, sometimes blood sugar increased may be prolonged  Hypoglycemic episodes occurred twice during the night with me at midnight and 4 AM and occasional low normal readings late morning or midday  Overnight periods: Blood sugars are usually on an average high at midnight and rapidly until about 4 AM with significant variability and low sugars twice as above  Preprandial periods: Blood sugars are rising before breakfast even though he is taking 1 click before his coffee in the morning on waking up and can be averaging about 180  when he has breakfast Blood sugars at lunchtime are averaging about 155 with some variability and 165-170 before dinner again with variability  Postprandial periods: After breakfast: Blood sugars are variably high after breakfast although they are starting high before eating also Sugars after lunch: Generally flat overall high only occasionally After dinner: Blood sugars will frequently show a rise in blood sugar, may not be as much preprandial blood sugar is higher when he will take more bolus dose   PRE-MEAL Fasting Lunch Dinner Bedtime Overall  Glucose range:       Mean/median:  152  156  165  200  171   POST-MEAL PC Breakfast PC Lunch PC Dinner  Glucose range:     Mean/median:  220  172  203      Self-care: The diet that the patient has been following is: tries to limit High-fat foods .     Typical meal intake: Breakfast is cereal, sometimes oatmeal or biscuits, lunch is a sandwich and fruit. Lunch 1 pm Dinner is baked chicken, salad and rice at 7 pm .   Snacks are on Peanut butter and crackers, having this at 10. 30 a.m. at work                Dietician visit, most recent: At diagnosis only CDE visit: 7/17               Exercise: On his feet at work.    Weight history: Maximum 285  Wt Readings from Last 3 Encounters:  08/30/18 210 lb (95.3 kg)  05/24/18 209 lb 9.6 oz (95.1 kg)  04/08/18 212 lb 2 oz (96.2 kg)    Glycemic control:   Lab Results  Component Value Date   HGBA1C 7.6 (A) 08/30/2018   HGBA1C 8.0 (A) 05/24/2018   HGBA1C 7.3 08/24/2017   Lab Results  Component Value Date   MICROALBUR <0.7 05/24/2018   LDLCALC 46 05/24/2018   CREATININE 1.32 05/24/2018   Lab Results  Component Value Date   MICRALBCREAT 0.8 05/24/2018       Allergies as of 08/30/2018      Reactions   Penicillins Rash      Medication List        Accurate as of 08/30/18 11:59 PM. Always use your most recent med list.          FREESTYLE LIBRE [place] 1 each by Does not apply route every 14 (fourteen) days.   glucose blood test strip USE TO CHECK BLOOD SUGAR 3 TIMES DAILY   insulin aspart 100 UNIT/ML injection Commonly known as:  novoLOG USE MAX 56 UNITS WITH V-GO PUMP   metFORMIN 1000 MG tablet Commonly known as:  GLUCOPHAGE TAKE ONE TABLET BY MOUTH TWICE DAILY WITH MEALS   omeprazole 40 MG capsule Commonly known as:  PRILOSEC Take 40 mg by mouth daily.   ONETOUCH DELICA LANCETS 95A Misc Use to check blood sugar 3 times per day.   ONETOUCH VERIO w/Device Kit USE TO CHECK BLOOD SUGAR 3 TIMES DAILY   rosuvastatin 5 MG tablet Commonly known as:  CRESTOR TAKE 1 TABLET BY MOUTH EVERY DAY   tamsulosin 0.4 MG Caps capsule Commonly known as:   FLOMAX Take 1 capsule (0.4 mg total) by mouth daily.   V-GO 20 Kit USE ONE PER DAY AS DIRECTED       Allergies:  Allergies  Allergen Reactions  . Penicillins Rash    Past Medical History:  Diagnosis Date  .  Diabetes mellitus without complication ([place])   . GERD (gastroesophageal reflux disease)   . High cholesterol   . History of kidney stones     Past Surgical History:  Procedure Laterality Date  . CARPAL TUNNEL RELEASE Bilateral    10 +years ago  . ESOPHAGOGASTRODUODENOSCOPY  2000   stretching of esophagus  . EXTRACORPOREAL SHOCK WAVE LITHOTRIPSY Right 04/08/2018   Procedure: RIGHT EXTRACORPOREAL SHOCK WAVE LITHOTRIPSY (ESWL);  Surgeon: Cleon Gustin, MD;  Location: [facility];  Service: Urology;  Laterality: Right;    Family History  Problem Relation Age of Onset  . Heart disease Mother   . Diabetes Mother   . Diabetes Sister   . Diabetes Maternal Grandmother     Social History:  reports that he has never smoked. He has never used smokeless tobacco. He reports that he does not drink alcohol or use drugs.   Review of Systems   Lipid history: LDL controlled with Crestor 5 mg    Lab Results  Component Value Date   CHOL 117 05/24/2018   HDL 53.40 05/24/2018   LDLCALC 46 05/24/2018   TRIG 85.0 05/24/2018   CHOLHDL 2 05/24/2018           Hypertension: Not on any treatment, blood pressure is variable  Recently renal function minimally abnormal, did previously have kidney stones  Lab Results  Component Value Date   CREATININE 1.32 05/24/2018   CREATININE 1.42 (H) 04/02/2018   CREATININE 1.12 12/24/2017      Most recent eye exam was 6/18  Most recent foot exam: 11/18   LABS:  Office Visit on 08/30/2018  Component Date Value Ref Range Status  . Hemoglobin A1C 08/30/2018 7.6* 4.0 - 5.6 % Final    Physical Examination:  BP 134/80   Pulse 84   Resp 16   Ht '6\' 1"'  (1.854 m)   Wt 210 lb (95.3 kg)   SpO2 97%   BMI 27.71 kg/m      ASSESSMENT:  Diabetes type 2, uncontrolled    See history of present illness for detailed discussion of current diabetes management, blood sugar patterns and problems identified  He has had long-standing diabetes, on basal bolus insulin on the  V-go pump for at least 2 years Also on metformin 1500 mg daily  His A1c is still not adequate although A1c is slightly better at 7.6 compared to 8%  Blood sugars show variability and variable amounts of hyperglycemia after meals However he also appears to have a significant dawn phenomenon with blood sugars sometimes going up to 200 in the morning when he drinks coffee and takes 2 units bolus Blood sugars are usually controlled during the day guidelines because of his being active but otherwise variable response in the evening Since he is adjusting his mealtime dose only based on pre-meal blood sugar is not covering variable amounts of carbohydrate and fat intake in the evening and this was discussed in detail Hypoglycemia overnight is still present occasionally and somewhat unpredictable but probably less with reducing evening metformin  Freestyle sensor readings are apparently not always accurate also and he would benefit from a different kind of sensor such as on the Medtronic pump or the guardian sensor Discussed his blood sugar patterns and showed him his blood sugar peaks and relationship to waking up for mealtime increase He is agreeable to using the insulin pump but still has not obtained the pump, partly due to concerns about his co-pay   PLAN for treatment:  He would try to adjust his mealtime dose at suppertime at least based on amount of carbohydrate and meal size and likely needs 1 more click on a regular basis  He will take his full dose of 6 units on waking up in the morning to prevent dawn phenomenon  Discussed appropriate treatment for hypoglycemia to avoid food and use mostly glucose tablets or simple sugars in liquid  form  Medtronic representative was contacted to help him get his coverage worked out  Counseling time on subjects discussed in assessment and plan sections is over 50% of today's 25 minute visit      Patient Instructions  Add 1 more click at supper  Click 3 x on  Waking up with coffee  Glucose tabs for lows  Counseling time on subjects discussed in assessment and plan sections is over 50% of today's 25 minute visit   Elayne Snare 08/31/2018, 2:42 PM   Note: This office note was prepared with Dragon voice recognition system technology. Any transcriptional errors that result from this process are unintentional.

## 2018-09-13 DIAGNOSIS — E119 Type 2 diabetes mellitus without complications: Secondary | ICD-10-CM | POA: Diagnosis not present

## 2018-09-13 DIAGNOSIS — Z794 Long term (current) use of insulin: Secondary | ICD-10-CM | POA: Diagnosis not present

## 2018-09-16 ENCOUNTER — Telehealth: Payer: Self-pay | Admitting: Endocrinology

## 2018-09-16 NOTE — Telephone Encounter (Signed)
Patient's wife Drinda Buttsnnette  requests patient have an appointment to learn how to use his insulin pump. Please call patient at ph# [phone] or patient's wife Drinda Buttsnnette ph# [phone] to advise.

## 2018-09-16 NOTE — Telephone Encounter (Signed)
Can this be scheduled with you Bonita QuinLinda- please advise

## 2018-09-17 NOTE — Telephone Encounter (Signed)
Scheduled for next Tuesday

## 2018-09-22 DIAGNOSIS — J00 Acute nasopharyngitis [common cold]: Secondary | ICD-10-CM | POA: Diagnosis not present

## 2018-09-24 ENCOUNTER — Other Ambulatory Visit: Payer: Self-pay

## 2018-09-24 ENCOUNTER — Encounter: Payer: BLUE CROSS/BLUE SHIELD | Attending: Endocrinology | Admitting: Nutrition

## 2018-09-24 DIAGNOSIS — E119 Type 2 diabetes mellitus without complications: Secondary | ICD-10-CM | POA: Insufficient documentation

## 2018-09-24 MED ORDER — GLUCOSE BLOOD VI STRP: 1.0000 | ORAL_STRIP | 3 refills | Status: AC | PRN

## 2018-09-25 ENCOUNTER — Ambulatory Visit: Payer: BLUE CROSS/BLUE SHIELD | Admitting: Endocrinology

## 2018-09-25 ENCOUNTER — Telehealth: Payer: Self-pay | Admitting: Nutrition

## 2018-09-25 ENCOUNTER — Telehealth: Payer: Self-pay | Admitting: Endocrinology

## 2018-09-25 NOTE — Telephone Encounter (Signed)
Is this okay with both of you?

## 2018-09-25 NOTE — Progress Notes (Addendum)
Patient and wife were trained on how to use the Medtronic 630 G pump.  We discussed the difference between his V-Go and this pump.    He is wearing a Josephine IgoLIbre, and was thinking that he was going to get the sensor for this as well.  The Medtronic rep. Was notified, and she said she would contact [employer] to get the paperwork started for this. Settings were put in per Dr. Remus BlakeKumar's order.  Basal rate: MN: 0.55, 5AM: 0.75, 8AM: 0.65, 4PM: 0.75 I/C: 10, ISF: 60, target: AM: 110-130, PM: 120-140     Timing, 4 hours. He does not count carbs and was given the bolus amounts that were used for his V-Go.  4u at breakfast and lunch, and 6u at supper.  He was shown how to put in "40 carbs" for his meal time breakfast amount, and he reported good understanding of this.  He re demonstrated this 3 times and reported good understanding of how to do this with all meals.  We discussed his sensor, and the fact that these readings are not his blood sugar, and the need to test blood sugars when there are arrows pointing up or down,for meal time insulin calculations. He reported good understanding of this.   He filled a reservoir and [place] 9mm infusion set as directed, and inserted this into his upper outer left abdomen. He was encouraged to read over his manual and booklet with directions for this, and how this pump works.  He removed his V-Go at this time.   He was told to test his blood sugar if readings have arrows pointing up or down, ac, and HS.  He agreed to do this.

## 2018-09-25 NOTE — Telephone Encounter (Signed)
Pt calling to let us know he is unable to make his appt for pump start follow up today however he can come tomorrow  Readings are as follows: 09/24/18 before 6 pm 225 09/24/18 around 8 pm 324 12/4 before 6 am 166 12/4 about 1:15 pm 134

## 2018-09-25 NOTE — Telephone Encounter (Signed)
I need to know what boluses he took and how many carbs he had for his meals since yesterday

## 2018-09-25 NOTE — Telephone Encounter (Signed)
Message left on machine to make changes to pump settings  He was given my cell to call for help, if he can not do it on his own.

## 2018-09-25 NOTE — Telephone Encounter (Signed)
Patient reported no difficulty giving self bolus for supper tonight.  Says blood sugar is now 233, 1 hour after supper. He reports that he is going to bed in a few minutes. He was reminded of his appointment with both Dr. Lucianne MussKumar and myself tomorrow.  He had no final questions for me.

## 2018-09-25 NOTE — Telephone Encounter (Signed)
Pt stated that he did what linda told him to do which was put in "40 before breakfast and lunch, and 60 before supper." He does not understand anything else other than this.

## 2018-09-25 NOTE — Patient Instructions (Addendum)
Read over Booklet on "using the 630 G pump" Give 4u (40 carbs) at breakfast and lunch, and 6u (60 carbs) at supper.   Test blood sugar before meals and at bedtime. Call 800 number on pump if questions.

## 2018-09-25 NOTE — Telephone Encounter (Signed)
Need to change the basal rates as follows: Midnight = 0.60, 4 PM = 0.85 He will also need to put in 60 g of carbohydrate for lunch and 80 for dinner instead of 40 and 60 Linda please call you can.  Does he know how to change his basal rates?

## 2018-09-26 ENCOUNTER — Encounter: Payer: Self-pay | Admitting: Endocrinology

## 2018-09-26 ENCOUNTER — Ambulatory Visit (INDEPENDENT_AMBULATORY_CARE_PROVIDER_SITE_OTHER): Payer: BLUE CROSS/BLUE SHIELD | Admitting: Endocrinology

## 2018-09-26 VITALS — BP 140/88 | HR 75 | Ht 73.0 in | Wt 210.0 lb

## 2018-09-26 DIAGNOSIS — Z794 Long term (current) use of insulin: Secondary | ICD-10-CM | POA: Diagnosis not present

## 2018-09-26 DIAGNOSIS — E1165 Type 2 diabetes mellitus with hyperglycemia: Secondary | ICD-10-CM

## 2018-09-26 NOTE — Progress Notes (Signed)
Patient ID: Christian Yang, male   DOB: [birth year], 57 y.o.   MRN: [id number]           Reason for Appointment: Follow-up for Type 2 Diabetes  Referring physician: Lenise ArenaMeyers   History of Present Illness:          Date of diagnosis of type 2 diabetes mellitus:   2008       Background history:   He apparently had a weight of 285 pounds at diagnosis He was having dry mouth and flulike symptoms at onset and glucose was 672 He was started on metformin is still taking Not clear what other medications he has been taking but he has been on Jardiance for 2 years and Bydureon since 6/17 Records indicated that his A1c has been 10-11+ since at least 2015 Levemir insulin was started 3-4 years prior to his consultation but without adequate control   He was started on the V-go pump in 04/2015  Recent history:   INSULIN regimen is:  Medtronic 670  BASAL settings: Midnight = 0.60, 4 PM = 0.85 Boluses: 50-60 g of carbohydrate for breakfast, 44 lunch and 80 for dinner      Non-insulin hypoglycemic drugs the patient is taking are: metformin 1 g twice a day   His A1c is consistently over 7%, now 7.6 compared to 8%  Current management, blood sugar patterns and problems identified:  He was started on the Medtronic pump 2 days ago  However he does not have the sensor but continues to use the freestyle libre  Blood sugars were high on the first evening his basal rates were increased yesterday after 4 PM  FASTING blood sugars appear to be consistently high were 166 yesterday and 185 today  His blood sugars tend to be the lowest during the day when he is working and active  Yesterday blood sugar was down to 113 at 1 PM and today down to 77 at about 11 AM while at work and was a little hypoglycemic with the low normal reading today  POSTPRANDIAL readings after breakfast appear to be consistently high and went up to 333 yesterday and about 300 today; this is with taking 4 units of bolus before  breakfast and he is eating a lean meat as a protein and relatively low calorie count  He also boluses for 10-20 g of carbs when he is having coffee on waking up with a couple of crackers  Blood sugars after lunch were flat to lower  Blood sugars after dinner yesterday were 164 and previously 324  OVERNIGHT blood sugars appear to be staying around 150-170   Side effects from diabetes medications have been: None  Hypoglycemia:   as above  Glucose monitoring:       Glucometer:  Freestyle Libre     Blood Glucose analysis: Since on the Medtronic pump as above  PREVIOUS readings: His average blood sugar on his last 2 weeks CGM is 180 with only 47% readings within target and 7% of readings below target   Self-care: The diet that the patient has been following is: tries to limit High-fat foods .     Typical meal intake: Breakfast is cereal, sometimes oatmeal or biscuits, lunch is a sandwich and fruit. Lunch 1 pm Dinner is baked chicken, salad and rice at 7 pm .   Snacks are on Peanut butter and crackers, having this at 10. 30 a.m. at work  Dietician visit, most recent: At diagnosis only CDE visit: 7/17               Exercise: On his feet at work.    Weight history: Maximum 285  Wt Readings from Last 3 Encounters:  09/26/18 210 lb (95.3 kg)  08/30/18 210 lb (95.3 kg)  05/24/18 209 lb 9.6 oz (95.1 kg)    Glycemic control:   Lab Results  Component Value Date   HGBA1C 7.6 (A) 08/30/2018   HGBA1C 8.0 (A) 05/24/2018   HGBA1C 7.3 08/24/2017   Lab Results  Component Value Date   MICROALBUR <0.7 05/24/2018   LDLCALC 46 05/24/2018   CREATININE 1.32 05/24/2018   Lab Results  Component Value Date   MICRALBCREAT 0.8 05/24/2018       Allergies as of 09/26/2018      Reactions   Penicillins Rash      Medication List        Accurate as of 09/26/18  4:25 PM. Always use your most recent med list.          glucose blood test strip 1 each by Other route as  needed for other. Check blood sugar before all meals and at HS. Check 2 hours after a meal 5 times per week.   insulin aspart 100 UNIT/ML injection Commonly known as:  novoLOG USE MAX 56 UNITS WITH V-GO PUMP   metFORMIN 1000 MG tablet Commonly known as:  GLUCOPHAGE TAKE ONE TABLET BY MOUTH TWICE DAILY WITH MEALS   omeprazole 40 MG capsule Commonly known as:  PRILOSEC Take 40 mg by mouth daily.   ONETOUCH DELICA LANCETS 33G Misc Use to check blood sugar 3 times per day.   rosuvastatin 5 MG tablet Commonly known as:  CRESTOR TAKE 1 TABLET BY MOUTH EVERY DAY       Allergies:  Allergies  Allergen Reactions  . Penicillins Rash    Past Medical History:  Diagnosis Date  . Diabetes mellitus without complication ([facility])   . GERD (gastroesophageal reflux disease)   . High cholesterol   . History of kidney stones     Past Surgical History:  Procedure Laterality Date  . CARPAL TUNNEL RELEASE Bilateral    10 +years ago  . ESOPHAGOGASTRODUODENOSCOPY  2000   stretching of esophagus  . EXTRACORPOREAL SHOCK WAVE LITHOTRIPSY Right 04/08/2018   Procedure: RIGHT EXTRACORPOREAL SHOCK WAVE LITHOTRIPSY (ESWL);  Surgeon: [name], MD;  Location: [facility];  Service: Urology;  Laterality: Right;    Family History  Problem Relation Age of Onset  . Heart disease Mother   . Diabetes Mother   . Diabetes Sister   . Diabetes Maternal Grandmother     Social History:  reports that he has never smoked. He has never used smokeless tobacco. He reports that he does not drink alcohol or use drugs.   Review of Systems  The following is a copy of the previous note:  Lipid history: LDL controlled with Crestor 5 mg    Lab Results  Component Value Date   CHOL 117 05/24/2018   HDL 53.40 05/24/2018   LDLCALC 46 05/24/2018   TRIG 85.0 05/24/2018   CHOLHDL 2 05/24/2018           Hypertension: Not on any treatment, blood pressure is variable  Recently renal function minimally  abnormal, did previously have kidney stones  Lab Results  Component Value Date   CREATININE 1.32 05/24/2018   CREATININE 1.42 (H) 04/02/2018   CREATININE 1.12  12/24/2017      Most recent eye exam was 6/18  Most recent foot exam: 11/18   LABS:  No visits with results within 1 Week(s) from this visit.  Latest known visit with results is:  Office Visit on 08/30/2018  Component Date Value Ref Range Status  . Hemoglobin A1C 08/30/2018 7.6* 4.0 - 5.6 % Final    Physical Examination:  BP 140/88 (BP Location: Left Arm, Patient Position: Sitting, Cuff Size: Normal)   Pulse 75   Ht 6\' 1"  (1.854 m)   Wt 210 lb (95.3 kg)   SpO2 95%   BMI 27.71 kg/m     ASSESSMENT:  Diabetes type 2, uncontrolled    See history of present illness for detailed discussion of current diabetes management, blood sugar patterns and problems identified  He has had long-standing diabetes, on basal bolus insulin and metformin  Most recent A1c was 7.6  Blood sugars before starting the Medtronic pump about 2 days ago were still high with average 180 and significant hypoglycemia and tendency to hypoglycemia  With starting the Medtronic pump his blood sugars are showing the following patterns: Overnight blood sugars are stable but mildly high Blood sugars are significantly high after breakfast but are well controlled after lunch and dinner He has a tendency to low normal readings during the day when he is active at work Blood sugars with the freestyle libre sensor are slightly lower than actual blood sugars only Contour meter  PLAN for treatment:     He will be getting the guardian sensor and will add this on to his pump when available  Meantime need to make the following changes for his basal rates  MIDNIGHT-9 AM = 0.7.  9 AM-4 PM = 0.45.  4 PM-10 PM = 0.8 and 10 PM = 0.9  Patient was supervised and making these changes in the office  Bolus amount will be 6 units for breakfast to be taken 15  minutes before eating  Also continue to bolus about 2 units for his morning coffee on waking up  Continue 4 units bolus for lunch but may need 6 units when not working  Call if he is having abnormal blood sugar patterns  Counseling time on subjects discussed in assessment and plan sections is over 50% of today's 25 minute visit      There are no Patient Instructions on file for this visit.    Reather Littler 09/26/2018, 4:25 PM   Note: This office note was prepared with Dragon voice recognition system technology. Any transcriptional errors that result from this process are unintentional.

## 2018-09-26 NOTE — Patient Instructions (Signed)
Lunch 40 (on weekends 60)  Bfst 20+ 60 bolus

## 2018-10-02 ENCOUNTER — Telehealth: Payer: Self-pay | Admitting: Endocrinology

## 2018-10-02 ENCOUNTER — Telehealth: Payer: Self-pay | Admitting: Nutrition

## 2018-10-02 DIAGNOSIS — E119 Type 2 diabetes mellitus without complications: Secondary | ICD-10-CM | POA: Diagnosis not present

## 2018-10-02 DIAGNOSIS — Z794 Long term (current) use of insulin: Secondary | ICD-10-CM | POA: Diagnosis not present

## 2018-10-02 NOTE — Telephone Encounter (Signed)
Patient contacted for appt. With Dr. Lucianne MussKumar on the 24th. At 10AM for sensor training and pump training.

## 2018-10-02 NOTE — Telephone Encounter (Signed)
[facility]Caller states he began a new insulin pump. He received everything but the sensor. The company states the office is sending the wrong documentation."

## 2018-10-02 NOTE — Telephone Encounter (Signed)
Spoke with pt and informed him that I would call [employer], his supplier and get the correct paperwork.

## 2018-10-04 NOTE — Telephone Encounter (Signed)
[employer] stated that they sent paperwork on 09/27/18 but upon further inquiry, it was determined that they mistakenly sent it to the incorrect paperwork. The correct fax number was given to them and they will be faxing to within the hour.

## 2018-10-15 ENCOUNTER — Ambulatory Visit (INDEPENDENT_AMBULATORY_CARE_PROVIDER_SITE_OTHER): Payer: BLUE CROSS/BLUE SHIELD | Admitting: Endocrinology

## 2018-10-15 ENCOUNTER — Encounter: Payer: Self-pay | Admitting: Endocrinology

## 2018-10-15 ENCOUNTER — Encounter: Payer: BLUE CROSS/BLUE SHIELD | Admitting: Nutrition

## 2018-10-15 VITALS — BP 132/84 | HR 65 | Ht 73.0 in | Wt 209.4 lb

## 2018-10-15 DIAGNOSIS — E1165 Type 2 diabetes mellitus with hyperglycemia: Secondary | ICD-10-CM

## 2018-10-15 DIAGNOSIS — Z794 Long term (current) use of insulin: Secondary | ICD-10-CM | POA: Diagnosis not present

## 2018-10-15 NOTE — Patient Instructions (Signed)
Use temp basal when active  Bolus all 6 units at same time in am  Enter proper carbs and add 15-30 more for hi fat at meals

## 2018-10-15 NOTE — Progress Notes (Signed)
Patient ID: Christian Yang, male   DOB: 01-08-1961, 57 y.o.   MRN: [id number]           Reason for Appointment: Follow-up for Type 2 Diabetes  Referring physician: Lenise Arena   History of Present Illness:          Date of diagnosis of type 2 diabetes mellitus:   2008       Background history:   He apparently had a weight of 285 pounds at diagnosis He was having dry mouth and flulike symptoms at onset and glucose was 672 He was started on metformin is still taking Not clear what other medications he has been taking but he has been on Jardiance for 2 years and Bydureon since 6/17 Records indicated that his A1c has been 10-11+ since at least 2015 Levemir insulin was started 3-4 years prior to his consultation but without adequate control   He was started on the V-go pump in 04/2015  Recent history:   INSULIN regimen is:  Medtronic 670 since 09/25/2018  BASAL settings: Midnight = 0.60, 4 PM = 0.85 Boluses: 50-60 g of carbohydrate for breakfast, 44 lunch and 80 for dinner      Non-insulin hypoglycemic drugs the patient is taking are: metformin 1 g twice a day   His A1c is consistently over 7%, last 7.6 compared to 8%  Current management, blood sugar patterns and problems identified:  He has had variable blood sugars on his insulin pump despite initial adjustments  His only today starting to use the guardian sensor since this was not also included  Also his freestyle libre sensor may be slightly lower than actual blood sugars  Blood sugar patterns are discussed below  Not clear why he had significant hypoglycemia for the whole day on 12/16 with blood sugars going up over 400 in the afternoon even with changing his infusion set the day before.  He is changing his infusion set as directed with proper priming procedure.  He has not been using the temporary basal or suspending his pump to prevent hypoglycemia when he is more active at work.  Also although he is sometimes going up on  his bolus at dinnertime based on his carbohydrate intake this is not being done consistently  Side effects from diabetes medications have been: None  Glucose monitoring:       Glucometer:  Freestyle Libre     Blood Glucose analysis:   CONTINUOUS GLUCOSE MONITORING RECORD INTERPRETATION    Dates of Recording: Last 2 weeks  Sensor description: Freestyle sensor  Results statistics:   CGM use % of time  76  Average and SD  153+/-70  Time in range  58       %  % Time Above 180  31   % Time above 250  8  % Time Below target  11    Glycemic patterns summary:   Hyperglycemic episodes are occurring mostly postprandially with the highest readings in the morning between 6-8 AM and also inconsistently in the afternoon and evening after meals  Hypoglycemic episodes occurred mostly before lunch with some symptoms bedtime and usually only while working  Overnight periods: Blood sugars are variable, occasionally relatively high and may be as low as 70 but generally averaging about 150  Preprandial periods: Blood sugars are recently better but mostly high before breakfast, relatively low with some variability at lunch and dinner and lowest before lunch without documented hypoglycemia on his pump.  Blood sugar may be lower  on his freestyle sensor  Postprandial periods: After breakfast: Blood sugars are generally rising significantly but mostly after his morning coffee even before he boluses for his breakfast and hour later After lunch: Blood sugars are only occasionally high with his meal bolus based on his diet    After dinner: He has occasional spikes up to nearly 300 after dinner based on his variable diet and highest readings were on the weekend prior to last  Averages from freestyle sensor:  PRE-MEAL Fasting Lunch Dinner Bedtime Overall  Glucose range:  120-362  88-400+     Mean/median:  145  115  129   153+/-70   POST-MEAL PC Breakfast PC Lunch PC Dinner  Glucose range:       Mean/median:  197  176  172     PREVIOUS readings: His average blood sugar on his last 2 weeks CGM is 180 with only 47% readings within target and 7% of readings below target   Self-care: The diet that the patient has been following is: tries to limit High-fat foods .     Typical meal intake: Breakfast is cereal, sometimes oatmeal or biscuits, lunch is a sandwich and fruit. Lunch 1 pm Dinner is baked chicken, salad and rice at 7 pm .   Snacks are on Peanut butter and crackers, having this at 10. 30 a.m. at work                Dietician visit, most recent: At diagnosis only CDE visit: 7/17               Exercise: On his feet at work.    Weight history: Maximum 285  Wt Readings from Last 3 Encounters:  10/15/18 209 lb 6.4 oz (95 kg)  09/26/18 210 lb (95.3 kg)  08/30/18 210 lb (95.3 kg)    Glycemic control:   Lab Results  Component Value Date   HGBA1C 7.6 (A) 08/30/2018   HGBA1C 8.0 (A) 05/24/2018   HGBA1C 7.3 08/24/2017   Lab Results  Component Value Date   MICROALBUR <0.7 05/24/2018   LDLCALC 46 05/24/2018   CREATININE 1.32 05/24/2018   Lab Results  Component Value Date   MICRALBCREAT 0.8 05/24/2018       Allergies as of 10/15/2018      Reactions   Penicillins Rash      Medication List       Accurate as of October 15, 2018 11:59 PM. Always use your most recent med list.        glucose blood test strip Commonly known as:  CONTOUR NEXT TEST 1 each by Other route as needed for other. Check blood sugar before all meals and at HS. Check 2 hours after a meal 5 times per week.   insulin aspart 100 UNIT/ML injection Commonly known as:  NOVOLOG USE MAX 56 UNITS WITH V-GO PUMP   metFORMIN 1000 MG tablet Commonly known as:  GLUCOPHAGE TAKE ONE TABLET BY MOUTH TWICE DAILY WITH MEALS   omeprazole 40 MG capsule Commonly known as:  PRILOSEC Take 40 mg by mouth daily.   ONETOUCH DELICA LANCETS 33G Misc Use to check blood sugar 3 times per day.    rosuvastatin 5 MG tablet Commonly known as:  CRESTOR TAKE 1 TABLET BY MOUTH EVERY DAY       Allergies:  Allergies  Allergen Reactions  . Penicillins Rash    Past Medical History:  Diagnosis Date  . Diabetes mellitus without complication (HCC)   . GERD (  gastroesophageal reflux disease)   . High cholesterol   . History of kidney stones     Past Surgical History:  Procedure Laterality Date  . CARPAL TUNNEL RELEASE Bilateral    10 +years ago  . ESOPHAGOGASTRODUODENOSCOPY  2000   stretching of esophagus  . EXTRACORPOREAL SHOCK WAVE LITHOTRIPSY Right 04/08/2018   Procedure: RIGHT EXTRACORPOREAL SHOCK WAVE LITHOTRIPSY (ESWL);  Surgeon: [name], MD;  Location: [facility];  Service: Urology;  Laterality: Right;    Family History  Problem Relation Age of Onset  . Heart disease Mother   . Diabetes Mother   . Diabetes Sister   . Diabetes Maternal Grandmother     Social History:  reports that he has never smoked. He has never used smokeless tobacco. He reports that he does not drink alcohol or use drugs.   Review of Systems  The following is a copy of the previous note:  Lipid history: LDL controlled with Crestor 5 mg    Lab Results  Component Value Date   CHOL 117 05/24/2018   HDL 53.40 05/24/2018   LDLCALC 46 05/24/2018   TRIG 85.0 05/24/2018   CHOLHDL 2 05/24/2018           Hypertension: Not on any treatment, blood pressure is variable  Recently renal function minimally abnormal, did previously have kidney stones  Lab Results  Component Value Date   CREATININE 1.32 05/24/2018   CREATININE 1.42 (H) 04/02/2018   CREATININE 1.12 12/24/2017      Most recent eye exam was 6/18  Most recent foot exam: 11/18   LABS:  No visits with results within 1 Week(s) from this visit.  Latest known visit with results is:  Office Visit on 08/30/2018  Component Date Value Ref Range Status  . Hemoglobin A1C 08/30/2018 7.6* 4.0 - 5.6 % Final    Physical  Examination:  BP 132/84 (BP Location: Left Arm, Patient Position: Sitting, Cuff Size: Normal)   Pulse 65   Ht 6\' 1"  (1.854 m)   Wt 209 lb 6.4 oz (95 kg)   SpO2 95%   BMI 27.63 kg/m     ASSESSMENT:  Diabetes type 2, uncontrolled    See history of present illness for detailed discussion of current diabetes management, blood sugar patterns and problems identified  He has had long-standing diabetes, on basal bolus insulin and metformin  Most recent A1c was 7.6  Blood sugar patterns are discussed above He does need to have better control of postprandial readings with adjusting his boluses based on what he is eating Also needs to try and avoid hypoglycemia at work which is not significant but has a variable trend before lunch Currently he is not having symptomatic hypoglycemia with the freestyle libre readings are relatively lower than actual readings  PLAN for treatment:     He will be starting the guardian sensor today with instructions  However discussed the need to avoid hypoglycemia while active at work with using temporary basal when blood sugars are low normal or trending low.  This was shown to him and he will try to use a 50% reduction for 1 to 2 hours up to his lunchtime  Will not change his basal rate as yet  We will need to start adjusting his boluses more consistently with counting carbohydrates along with adding 10-30 g for higher fat meals  Since he will be going in the auto mode in a couple of weeks will not confuse him with using the dual bolus  at this time  Does need to bolus consistently before meal  To call if he has hyperglycemic episodes persistently any day but most likely needs to change his infusion set for prolonged hypoglycemia  Counseling time on subjects discussed in assessment and plan sections is over 50% of today's 25 minute visit   Follow-up in a month after starting auto mode with the help of nurse educator   Patient Instructions  Use temp  basal when active  Bolus all 6 units at same time in am  Enter proper carbs and add 15-30 more for hi fat at meals     Reather LittlerAjay Contessa Preuss 10/16/2018, 12:07 PM   Note: This office note was prepared with Dragon voice recognition system technology. Any transcriptional errors that result from this process are unintentional.

## 2018-10-29 ENCOUNTER — Encounter: Payer: BLUE CROSS/BLUE SHIELD | Attending: Endocrinology | Admitting: Nutrition

## 2018-10-29 DIAGNOSIS — E119 Type 2 diabetes mellitus without complications: Secondary | ICD-10-CM | POA: Insufficient documentation

## 2018-11-05 ENCOUNTER — Telehealth: Payer: Self-pay | Admitting: Nutrition

## 2018-11-05 NOTE — Progress Notes (Signed)
Patient reports that his blood sugars are no better on this pump, as compared to the V-go.  He says it is too expensive for him to be on the sensors and the pump.  He wants to go back on the V-go.  Dr. Lucianne MussKumar told this.  I phoned French Anaracy from [employer] for the patient and informed her of patient's request.  He has been on this for 1 month and 6 days.  He reports having called [employer] about sending this back, and they told him it was "non refundable".  French Anaracy notified of this.  Patient told to call [employer] and stop all shipments of supplies and sensors.  2 sensors given to him to finish up until he can use up pump supplies.

## 2018-11-05 NOTE — Telephone Encounter (Signed)
Email from [employer] (the [employer] rep. That she tried calling patient, with no return call as yet.  I talked with him and told him to call her to start the return process.  He says he went to [facility] yesterday and picked up his V-go, and will start back on this tomorrow.

## 2018-11-05 NOTE — Telephone Encounter (Signed)
He is coming back on the 24th, has he looked at the OmniPod?

## 2018-11-05 NOTE — Patient Instructions (Signed)
Call [employer] and stop all supplies.   Call Darrol Jump to inform her of your need to send this back.

## 2018-11-15 ENCOUNTER — Ambulatory Visit: Payer: BLUE CROSS/BLUE SHIELD | Admitting: Endocrinology

## 2018-12-03 ENCOUNTER — Other Ambulatory Visit: Payer: Self-pay

## 2018-12-03 ENCOUNTER — Telehealth: Payer: Self-pay | Admitting: Endocrinology

## 2018-12-03 ENCOUNTER — Other Ambulatory Visit: Payer: Self-pay | Admitting: Endocrinology

## 2018-12-03 MED ORDER — METFORMIN HCL 1000 MG PO TABS: 1000.0000 mg | ORAL_TABLET | Freq: Two times a day (BID) | ORAL | 5 refills | Status: DC

## 2018-12-03 NOTE — Telephone Encounter (Signed)
Patient would like his metformin prescription sent to   [facility] [address] [place] - [address]

## 2018-12-03 NOTE — Telephone Encounter (Signed)
error 

## 2018-12-13 ENCOUNTER — Encounter: Payer: Self-pay | Admitting: Endocrinology

## 2018-12-13 ENCOUNTER — Other Ambulatory Visit: Payer: Self-pay

## 2018-12-13 ENCOUNTER — Ambulatory Visit: Payer: BLUE CROSS/BLUE SHIELD | Admitting: Endocrinology

## 2018-12-13 VITALS — BP 140/80 | HR 67 | Ht 73.0 in | Wt 213.8 lb

## 2018-12-13 DIAGNOSIS — E7800 Pure hypercholesterolemia, unspecified: Secondary | ICD-10-CM | POA: Diagnosis not present

## 2018-12-13 DIAGNOSIS — E1165 Type 2 diabetes mellitus with hyperglycemia: Secondary | ICD-10-CM

## 2018-12-13 DIAGNOSIS — Z794 Long term (current) use of insulin: Secondary | ICD-10-CM

## 2018-12-13 LAB — POCT GLYCOSYLATED HEMOGLOBIN (HGB A1C): Hemoglobin A1C: 7.4 % — AB (ref 4.0–5.6)

## 2018-12-13 MED ORDER — GLUCOSE BLOOD VI STRP: ORAL_STRIP | 3 refills | Status: DC

## 2018-12-13 NOTE — Patient Instructions (Addendum)
4 clicks with coffee and 1-2 at bfst  Stop pm Metformin  Take at lunch only

## 2018-12-13 NOTE — Progress Notes (Signed)
Patient ID: Christian Yang, male   DOB: 1961-05-11, 58 y.o.   MRN: [id number]           Reason for Appointment: Follow-up for Type 2 Diabetes  Referring physician: Olen Pel   History of Present Illness:          Date of diagnosis of type 2 diabetes mellitus:   2008       Background history:   He apparently had a weight of 285 pounds at diagnosis He was having dry mouth and flulike symptoms at onset and glucose was 672 He was started on metformin is still taking Not clear what other medications he has been taking but he has been on Jardiance for 2 years and Bydureon since 6/17 Records indicated that his A1c has been 10-11+ since at least 2015 Levemir insulin was started 3-4 years prior to his consultation but without adequate control   He was started on the V-go pump in 04/2015  Recent history:   INSULIN regimen is: Insulin pump, V-go 20 basal, mealtime clicks 4 at breakfast and lunch and 4-6 at dinner  Non-insulin hypoglycemic drugs the patient is taking are: metformin 1 g a.m.--0.5 p.m.   His A1c is consistently over 7%, now 7.4, previously 7.6  Current management, blood sugar patterns and problems identified:  He has stopped using the Medtronic pump because of cost  Also on his own has gone back to the V-go pump  As before he is getting HYPOGLYCEMIA with this overnight or late morning and occasionally before dinner periodically  Also VARIABILITY is significantly increased especially in the afternoons and evenings  He has more readings on his CGM over 250 compared to the Medtronic pump  Review of his CGM indicates the highest blood sugar is after breakfast  He does not bolus enough to cover his breakfast because of tendency to hypoglycemia at work by about 9-10 AM when he is more active  He will take 1 or 2 clicks in the morning when he has coffee after waking up before usual breakfast time and the rest at breakfast  FASTING blood sugars are quite variable with  inconsistent overnight patterns  MEALTIME coverage in the afternoons and evenings is also inconsistent with some high readings based on his type of meal and adequacy of boluses  He is likely not taking enough insulin to cover his meals when his blood sugars are low normal before eating  Previously metformin was reduced in the evening because of tendency to overnight hypoglycemia  Side effects from diabetes medications have been: None  Glucose monitoring:       Glucometer:  Freestyle Libre     Blood Glucose analysis:  CGM use % of time  94  2-week average/SD  162+/-41  Time in range      58 %  % Time Above 180 25  % Time above 250  11  % Time Below 70  6     PRE-MEAL Fasting Lunch Dinner Bedtime Overall  Glucose range:       Averages:  136   159  161    POST-MEAL PC Breakfast PC Lunch PC Dinner  Glucose range:  202-330    Averages:  221  191  174     Self-care: The diet that the patient has been following is: tries to limit High-fat foods .     Typical meal intake: Breakfast is cereal, sometimes oatmeal or biscuits, lunch is a sandwich and fruit. Lunch 1 pm Dinner is  baked chicken, salad and rice at 7 pm .   Snacks are on Peanut butter and crackers, having this at 10. 30 a.m. at work                Dietician visit, most recent: At diagnosis only CDE visit: 7/17               Exercise: On his feet at work.    Weight history: Maximum 285  Wt Readings from Last 3 Encounters:  12/13/18 213 lb 12.8 oz (97 kg)  10/15/18 209 lb 6.4 oz (95 kg)  09/26/18 210 lb (95.3 kg)    Glycemic control:   Lab Results  Component Value Date   HGBA1C 7.4 (A) 12/13/2018   HGBA1C 7.6 (A) 08/30/2018   HGBA1C 8.0 (A) 05/24/2018   Lab Results  Component Value Date   MICROALBUR <0.7 05/24/2018   LDLCALC 46 05/24/2018   CREATININE 1.32 05/24/2018   Lab Results  Component Value Date   MICRALBCREAT 0.8 05/24/2018       Allergies as of 12/13/2018      Reactions   Penicillins  Rash      Medication List       Accurate as of December 13, 2018 11:59 PM. Always use your most recent med list.        FREESTYLE LIBRE [place] by Does not apply route.   FREESTYLE LIBRE 14 DAY SENSOR Misc 1 each by Does not apply route every 14 (fourteen) days. APPLY 1 SENSOR TO BODY ONCE EVERY 14 DAYS TO MONITOR BLOOD SUGAR.   glucose blood test strip Commonly known as:  CONTOUR NEXT TEST 1 each by Other route as needed for other. Check blood sugar before all meals and at HS. Check 2 hours after a meal 5 times per week.   glucose blood test strip Commonly known as:  FREESTYLE PRECISION NEO TEST Use as instructed to check blood sugar 3 times daily.   insulin aspart 100 UNIT/ML injection Commonly known as:  NOVOLOG USE MAX 56 UNITS WITH MEDTRONIC PUMP.   metFORMIN 1000 MG tablet Commonly known as:  GLUCOPHAGE Take 1 tablet (1,000 mg total) by mouth 2 (two) times daily with a meal.   omeprazole 40 MG capsule Commonly known as:  PRILOSEC Take 40 mg by mouth daily.   ONETOUCH DELICA LANCETS 16X Misc Use to check blood sugar 3 times per day.   rosuvastatin 5 MG tablet Commonly known as:  CRESTOR TAKE 1 TABLET BY MOUTH EVERY DAY   V-GO 20 Kit 1 each by Does not apply route daily. Apply 1 V-Go sensor to body once every day for insulin delivery.       Allergies:  Allergies  Allergen Reactions  . Penicillins Rash    Past Medical History:  Diagnosis Date  . Diabetes mellitus without complication ([place])   . GERD (gastroesophageal reflux disease)   . High cholesterol   . History of kidney stones     Past Surgical History:  Procedure Laterality Date  . CARPAL TUNNEL RELEASE Bilateral    10 +years ago  . ESOPHAGOGASTRODUODENOSCOPY  2000   stretching of esophagus  . EXTRACORPOREAL SHOCK WAVE LITHOTRIPSY Right 04/08/2018   Procedure: RIGHT EXTRACORPOREAL SHOCK WAVE LITHOTRIPSY (ESWL);  Surgeon: Cleon Gustin, MD;  Location: [facility];  Service:  Urology;  Laterality: Right;    Family History  Problem Relation Age of Onset  . Heart disease Mother   . Diabetes Mother   .  Diabetes Sister   . Diabetes Maternal Grandmother     Social History:  reports that he has never smoked. He has never used smokeless tobacco. He reports that he does not drink alcohol or use drugs.   Review of Systems     Lipid history: LDL controlled with Crestor 5 mg    Lab Results  Component Value Date   CHOL 117 05/24/2018   HDL 53.40 05/24/2018   LDLCALC 46 05/24/2018   TRIG 85.0 05/24/2018   CHOLHDL 2 05/24/2018           Hypertension: Not on any treatment, blood pressure is variable  BP Readings from Last 3 Encounters:  12/13/18 140/80  10/15/18 132/84  09/26/18 140/88     Previously renal function minimally abnormal, did previously have kidney stones, has not had follow-up  Lab Results  Component Value Date   CREATININE 1.32 05/24/2018   CREATININE 1.42 (H) 04/02/2018   CREATININE 1.12 12/24/2017      Most recent eye exam was 6/18  Most recent foot exam: 11/18   LABS:  Office Visit on 12/13/2018  Component Date Value Ref Range Status  . Hemoglobin A1C 12/13/2018 7.4* 4.0 - 5.6 % Final    Physical Examination:  BP 140/80 (BP Location: Left Arm, Patient Position: Sitting, Cuff Size: Normal)   Pulse 67   Ht _0  (1.854 m)   Wt 213 lb 12.8 oz (97 kg)   SpO2 97%   BMI 28.21 kg/m     ASSESSMENT:  Diabetes type 2, uncontrolled    See history of present illness for detailed discussion of current diabetes management, blood sugar patterns and problems identified  He has had long-standing diabetes, on basal bolus insulin and metformin  A1c is 7.4  Although he would have benefited from the 670 pump he cannot afford this especially the sensor and he has had difficulties with consistent insurance coverage also Currently he is back on the V-go pump  Discussed the deficiency of the V-go pump as seen above with  tendency to hypoglycemia including overnight and sometimes at work late morning Also he is not able to get enough coverage for his breakfast and sometimes other meals as he is afraid of hypoglycemia and also not fine-tuning his boluses based on carbohydrate intake or blood sugars However he is still very reluctant to change the V-go pump because of cost reasons only   PLAN for treatment:     Discussed that if he can continue to use the Medtronic pump with the available infusion sites he can do better  Also explained to him the use of the Omni pod insulin pump and he can look into the cost of this as an option  Since he may have sometimes falsely low readings with the freestyle libre can do fingersticks with the libre meter rather than the Contour meter which he is doing currently  Advised him to bolus all his 4 clicks in the morning before drinking coffee rather than taking some before breakfast  He will move his morning metformin to lunchtime and also stop evening metformin completely To try and adjust his bolus clicks based on portions and meal size as well as carbohydrates at lunch and dinner He will call if he has any changes in his plan for using a different pump  Needs follow-up labs for his renal function and lipids  Counseling time on subjects discussed in assessment and plan sections is over 50% of today's 25 minute visit  Patient Instructions  4 clicks with coffee and 1-2 at bfst  Stop pm Metformin  Take at lunch only     Elayne Snare 12/15/2018, 2:23 PM   Note: This office note was prepared with Dragon voice recognition system technology. Any transcriptional errors that result from this process are unintentional.

## 2018-12-16 ENCOUNTER — Telehealth: Payer: Self-pay | Admitting: Endocrinology

## 2018-12-16 NOTE — Telephone Encounter (Signed)
Patient's wife Drinda Butts called re: Patient has been unable to get a [place] due to no pharmacy's have them anymore. Does Dr. [name] have any for patient, per wife's request. Please call ph# [phone] to advise.

## 2018-12-16 NOTE — Telephone Encounter (Signed)
Called pt and left message explaining that we do not have any samples of V-Go 20 pumps, and that he should call around to pharmacies in his area to see if they have any that he can get.

## 2018-12-18 ENCOUNTER — Telehealth: Payer: Self-pay | Admitting: Endocrinology

## 2018-12-18 ENCOUNTER — Other Ambulatory Visit: Payer: Self-pay

## 2018-12-18 MED ORDER — FREESTYLE LIBRE 14 DAY SENSOR MISC: 1.0000 | 3 refills | Status: DC

## 2018-12-18 NOTE — Telephone Encounter (Signed)
Patient called [facility] 12/17/2018  Stated he is needing his free style libra sensor called in. He has none left and was suppose to change the one he has on now last night     [id number] - [place], [place] - [address]

## 2018-12-18 NOTE — Telephone Encounter (Signed)
Rx sent 

## 2018-12-24 ENCOUNTER — Other Ambulatory Visit: Payer: Self-pay

## 2018-12-24 ENCOUNTER — Telehealth: Payer: Self-pay | Admitting: Endocrinology

## 2018-12-24 MED ORDER — FREESTYLE LIBRE 14 DAY SENSOR MISC: 1.0000 | 3 refills | Status: DC

## 2018-12-24 MED ORDER — INSULIN ASPART 100 UNIT/ML ~~LOC~~ SOLN: SUBCUTANEOUS | 3 refills | Status: DC

## 2018-12-24 NOTE — Telephone Encounter (Signed)
Patient requests to be called at ph# [phone] re: problems with Rx's being correct and patient has been waiting to hear back from our office for over a week.

## 2018-12-24 NOTE — Telephone Encounter (Signed)
Called pt and he stated that the pharmacy would not allow him to get more than one freestyle libre sensor. Another Rx was sent for qty 3 sensors. Pt also stated that according to his insurance, he is being charged a double copay for any insulin supply over 30 days worth. Currently, pt gets 29ml's per month of insulin which is 35 days worth of insulin in relation to his current dosage. Dr. [name] increased the dosage of insulin from 56 units daily via v-go pump to 65 units daily via v-go pump to accommodate the use of 2 vials of insulin. Pt was informed of this and verbalized understanding. Pt did verbalize understanding to continue taking 56 units of insulin daily, in spite of the new prescription.

## 2019-01-08 ENCOUNTER — Telehealth: Payer: Self-pay | Admitting: Endocrinology

## 2019-01-08 NOTE — Telephone Encounter (Signed)
Patient requests to be called re: double copay at Pharmacy at ph# [phone]

## 2019-01-08 NOTE — Telephone Encounter (Signed)
Called pharmacy and the pharmacist stated that insurance stated that copay per bottle of insulin is $45. The patient picked up 2 vials of insulin at $45 per vial, making the total cost of $90. Pt was called and explained that this was not a double copay, this was paying for the amount that the insurance would not cover.

## 2019-01-16 ENCOUNTER — Other Ambulatory Visit: Payer: Self-pay | Admitting: Endocrinology

## 2019-03-04 ENCOUNTER — Other Ambulatory Visit: Payer: Self-pay

## 2019-03-08 ENCOUNTER — Other Ambulatory Visit: Payer: Self-pay | Admitting: Endocrinology

## 2019-03-21 ENCOUNTER — Ambulatory Visit: Payer: BLUE CROSS/BLUE SHIELD | Admitting: Endocrinology

## 2019-04-12 ENCOUNTER — Other Ambulatory Visit: Payer: Self-pay | Admitting: Endocrinology

## 2019-07-01 ENCOUNTER — Other Ambulatory Visit: Payer: Self-pay | Admitting: Endocrinology

## 2019-07-02 ENCOUNTER — Telehealth: Payer: Self-pay

## 2019-07-02 NOTE — Telephone Encounter (Signed)
We cannot do a PSA unless he schedules follow-up appointment

## 2019-07-02 NOTE — Telephone Encounter (Signed)
Received fax from pt's pharmacy stating that a PA is needed for V-go 40. Would you like to do the PA or change?

## 2019-07-03 ENCOUNTER — Other Ambulatory Visit: Payer: Self-pay

## 2019-07-03 MED ORDER — V-GO 20 KIT: PACK | 0 refills | Status: DC

## 2019-07-03 NOTE — Telephone Encounter (Signed)
Please schedule pt for f/u as soon as possible.

## 2019-07-03 NOTE — Telephone Encounter (Signed)
LVM to Schedule °

## 2019-07-06 ENCOUNTER — Other Ambulatory Visit: Payer: Self-pay | Admitting: Endocrinology

## 2019-07-21 ENCOUNTER — Other Ambulatory Visit: Payer: Self-pay

## 2019-07-21 NOTE — Progress Notes (Signed)
Patient ID: Christian Yang, male   DOB: [birth year], 58 y.o.   MRN: [id number]           Reason for Appointment: Follow-up for Type 2 Diabetes  Referring physician: Olen Pel   History of Present Illness:          Date of diagnosis of type 2 diabetes mellitus:   2008       Background history:   He apparently had a weight of 285 pounds at diagnosis He was having dry mouth and flulike symptoms at onset and glucose was 672 He was started on metformin is still taking Not clear what other medications he has been taking but he has been on Jardiance for 2 years and Bydureon since 6/17 Records indicated that his A1c has been 10-11+ since at least 2015 Levemir insulin was started 3-4 years prior to his consultation but without adequate control   He was started on the V-go pump in 04/2015  Recent history:   INSULIN regimen is: Insulin pump, V-go 20 basal, mealtime clicks 4 at breakfast and lunch and 4-6 at dinner  Non-insulin hypoglycemic drugs the patient is taking are: metformin 1 g a.m.--0.5 p.m.  He has not been seen in follow-up since 2/20  Current management, blood sugar patterns and problems identified:  A1c is now 8 compared to 7.4  He has continued to use the V-go pump  Although he was told to move his metformin to lunchtime he is still taking this at breakfast  His blood sugars may tend to get low normal or low late morning or before lunch  However he will only have a snack with peanut butter crackers if his blood sugars are getting lower  Not clear if his freestyle [place] is accurate as he does not compared with fingersticks regularly but at times it is not coordinating  Also he usually does not feel the symptoms of hypoglycemia until blood sugars are in the 50s  Although he is trying to use some protein like meat or eggs in the mornings he has variable response to breakfast  Also occasionally have high readings after dinner based on his diet  His weight appears to be  going up  Side effects from diabetes medications have been: None  Glucose monitoring:       Glucometer:  Freestyle Libre      CONTINUOUS GLUCOSE MONITORING RECORD INTERPRETATION    Dates of Recording: 9/16 through 9/29  Sensor description: [employer]  Results statistics:   CGM use % of time  98  Average and SD  142+/-41  Time in range       69% was 58  % Time Above 180  19  % Time above 250  4  % Time Below target  8    Glycemic patterns summary: Blood sugars are showing moderate fluctuating patterns throughout the day He has variable stenosis.  Low normal or low sugars late morning and before lunch and also occasionally before and after supper. Has had variable degree of hyperglycemia after meals, least frequently after lunch  Hyperglycemic episodes are occurring mostly to a variable extent right after breakfast or dinner and occasionally late at night.  Had hyperglycemia last Sunday throughout the day for about 24 hours  Hypoglycemic episodes occurred a couple of times early morning, periodically late morning or before lunchtime, infrequently before dinner and once after dinner  Overnight periods: Blood sugars averaging about 170 before midnight and then gradually decreasing until about 5 AM when  average blood sugar is about 119  Preprandial periods: Blood sugar is usually fairly good at breakfast time although showing a slight increase Blood sugars at lunchtime are excellent with some variability and averaging about 126 Before dinnertime blood sugars are variably high but averaging about 140-150  Postprandial periods:   After breakfast: Blood sugars may go up to a variable extent but usually not over 200    After lunch:   Blood sugars are usually levels with the pre-meal blood sugars and low normal only once  After dinner: Blood sugar do fluctuate with overall mild increase and gradually rising until about midnight.  Although his dinner meals are around 7  PM   PRE-MEAL Fasting Lunch Dinner Bedtime Overall  Glucose range:       Mean/median:  119  124  149   142   POST-MEAL PC Breakfast PC Lunch PC Dinner  Glucose range:     Mean/median:  153  145  169    Previous data:  Blood Glucose analysis:  CGM use % of time  94  2-week average/SD  162+/-41  Time in range      58 %  % Time Above 180 25  % Time above 250  11  % Time Below 70  6     PRE-MEAL Fasting Lunch Dinner Bedtime Overall  Glucose range:       Averages:  136   159  161    POST-MEAL PC Breakfast PC Lunch PC Dinner  Glucose range:  202-330    Averages:  221  191  174     Self-care: The diet that the patient has been following is: tries to limit High-fat foods .     Typical meal intake: Breakfast is cereal, sometimes oatmeal or biscuits, lunch is a sandwich and fruit. Lunch 1 pm Dinner is baked chicken, salad and rice at 7 pm .   Snacks are on Peanut butter and crackers, having this at 10. 30 a.m. at work                Dietician visit, most recent: At diagnosis only CDE visit: 7/17               Exercise: On his feet at work.    Weight history: Maximum 285  Wt Readings from Last 3 Encounters:  07/22/19 220 lb 9.6 oz (100.1 kg)  12/13/18 213 lb 12.8 oz (97 kg)  10/15/18 209 lb 6.4 oz (95 kg)    Glycemic control:   Lab Results  Component Value Date   HGBA1C 8.0 (A) 07/22/2019   HGBA1C 7.4 (A) 12/13/2018   HGBA1C 7.6 (A) 08/30/2018   Lab Results  Component Value Date   MICROALBUR <0.7 05/24/2018   LDLCALC 46 05/24/2018   CREATININE 1.32 05/24/2018   Lab Results  Component Value Date   MICRALBCREAT 0.8 05/24/2018       Allergies as of 07/22/2019      Reactions   Penicillins Rash      Medication List       Accurate as of July 22, 2019  4:05 PM. If you have any questions, ask your nurse or doctor.        FreeStyle Libre 14 Day Reader Kerrin Mo by Does not apply route.   FreeStyle Libre 14 Day Sensor Misc 1 each by Does not apply  route every 14 (fourteen) days. APPLY 1 SENSOR TO BODY ONCE EVERY 14 DAYS TO MONITOR BLOOD SUGAR.   glucose blood  test strip Commonly known as: Contour Next Test 1 each by Other route as needed for other. Check blood sugar before all meals and at HS. Check 2 hours after a meal 5 times per week.   glucose blood test strip Commonly known as: FreeStyle Precision Neo Test Use as instructed to check blood sugar 3 times daily.   Contour Next Test test strip Generic drug: glucose blood CHECK BLOOD SUGAR BEFORE ALL MEALS AND AT BEDTIME. CHECK 2 HOURS AFTER A MEAL 5 TIMES PER WEEK.   insulin aspart 100 UNIT/ML injection Commonly known as: NovoLOG USE MAX 65 UNITS WITH V-GO INSULIN PUMP DAILY   metFORMIN 1000 MG tablet Commonly known as: GLUCOPHAGE Take 1 tablet (1,000 mg total) by mouth 2 (two) times daily with a meal.   omeprazole 40 MG capsule Commonly known as: PRILOSEC Take 40 mg by mouth daily.   OneTouch Delica Lancets 21J Misc Use to check blood sugar 3 times per day.   rosuvastatin 5 MG tablet Commonly known as: CRESTOR TAKE 1 TABLET BY MOUTH EVERY DAY   V-Go 20 Kit USE AS DIRECTED ONCE DAILY       Allergies:  Allergies  Allergen Reactions   Penicillins Rash    Past Medical History:  Diagnosis Date   Diabetes mellitus without complication (HCC)    GERD (gastroesophageal reflux disease)    High cholesterol    History of kidney stones     Past Surgical History:  Procedure Laterality Date   CARPAL TUNNEL RELEASE Bilateral    10 +years ago   ESOPHAGOGASTRODUODENOSCOPY  2000   stretching of esophagus   EXTRACORPOREAL SHOCK WAVE LITHOTRIPSY Right 04/08/2018   Procedure: RIGHT EXTRACORPOREAL SHOCK WAVE LITHOTRIPSY (ESWL);  Surgeon: Cleon Gustin, MD;  Location: [facility];  Service: Urology;  Laterality: Right;    Family History  Problem Relation Age of Onset   Heart disease Mother    Diabetes Mother    Diabetes Sister    Diabetes Maternal  Grandmother     Social History:  reports that he has never smoked. He has never used smokeless tobacco. He reports that he does not drink alcohol or use drugs.   Review of Systems     Lipid history: LDL usually controlled with Crestor 5 mg    Lab Results  Component Value Date   CHOL 117 05/24/2018   HDL 53.40 05/24/2018   LDLCALC 46 05/24/2018   TRIG 85.0 05/24/2018   CHOLHDL 2 05/24/2018           Hypertension: Not on any treatment, blood pressure is variable He says his blood pressure at home is about 135/85 with using wrist cuff He has not followed up with PCP  BP Readings from Last 3 Encounters:  07/22/19 (!) 150/86  12/13/18 140/80  10/15/18 132/84     Previously renal function  abnormal without diabetic nephropathy  Lab Results  Component Value Date   CREATININE 1.32 05/24/2018   CREATININE 1.42 (H) 04/02/2018   CREATININE 1.12 12/24/2017     Most recent eye exam was 6/18  Most recent foot exam: 9/20   LABS:  Office Visit on 07/22/2019  Component Date Value Ref Range Status   Hemoglobin A1C 07/22/2019 8.0* 4.0 - 5.6 % Final    Physical Examination:  BP (!) 150/86    Pulse 68    Wt 220 lb 9.6 oz (100.1 kg)    SpO2 97%    BMI 29.10 kg/m   Repeat blood pressure 160/88  Diabetic Foot Exam - Simple   Simple Foot Form Diabetic Foot exam was performed with the following findings: Yes   Visual Inspection No deformities, no ulcerations, no other skin breakdown bilaterally: Yes Sensation Testing Intact to touch and monofilament testing bilaterally: Yes Pulse Check Posterior Tibialis and Dorsalis pulse intact bilaterally: Yes Comments      ASSESSMENT:  Diabetes type 2, uncontrolled    See history of present illness for detailed discussion of current diabetes management, blood sugar patterns and problems identified  He has had long-standing diabetes, on basal bolus insulin and metformin  A1c is 8  Not clear why his A1c is higher with his  home readings averaging only 142 on the sensor recently May have falsely low readings at times on his freestyle [place]  He does have periodically low blood sugars and his libre indicates 8% of his readings below 70   PLAN for treatment:     He will look at the cost of using the Medtronic pump with the sensor and supplies again, not clear if he is eligible for the 770 version  He will move his metformin to lunchtime  If his renal function is normal today we will consider [place], discussed that this should help his weight and borderline blood pressure also Discussed action of SGLT 2 drugs on lowering glucose by decreasing kidney absorption of glucose, benefits of weight loss and lower blood pressure, possible side effects including candidiasis and dosage regimen   He will continue to adjust his boluses based on his expected activity level and carbohydrate intake  May also consider Dexcom instead of freestyle libre to provide better accuracy although this may be more expensive  If he does start Wilder Glade will likely not use metformin  Needs follow-up labs for his renal function and lipids today  For blood pressure he needs to bring his monitor into the office to compare, likely needs to be on antihypertensive although may benefit from [place] also  Recommended influenza vaccine but he refuses  Counseling time on subjects discussed in assessment and plan sections is over 50% of today's 25 minute visit   There are no Patient Instructions on file for this visit.    Elayne Snare 07/22/2019, 4:05 PM   Note: This office note was prepared with Dragon voice recognition system technology. Any transcriptional errors that result from this process are unintentional.

## 2019-07-22 ENCOUNTER — Encounter: Payer: Self-pay | Admitting: Endocrinology

## 2019-07-22 ENCOUNTER — Ambulatory Visit: Payer: BLUE CROSS/BLUE SHIELD | Admitting: Endocrinology

## 2019-07-22 VITALS — BP 150/86 | HR 68 | Wt 220.6 lb

## 2019-07-22 DIAGNOSIS — N289 Disorder of kidney and ureter, unspecified: Secondary | ICD-10-CM

## 2019-07-22 DIAGNOSIS — Z794 Long term (current) use of insulin: Secondary | ICD-10-CM | POA: Diagnosis not present

## 2019-07-22 DIAGNOSIS — E7800 Pure hypercholesterolemia, unspecified: Secondary | ICD-10-CM

## 2019-07-22 DIAGNOSIS — E1165 Type 2 diabetes mellitus with hyperglycemia: Secondary | ICD-10-CM | POA: Diagnosis not present

## 2019-07-22 LAB — POCT GLYCOSYLATED HEMOGLOBIN (HGB A1C): Hemoglobin A1C: 8 % — AB (ref 4.0–5.6)

## 2019-07-22 NOTE — Patient Instructions (Signed)
Take Metformin at lunch

## 2019-07-23 LAB — MICROALBUMIN / CREATININE URINE RATIO
Creatinine,U: 205 mg/dL
Microalb Creat Ratio: 0.4 mg/g (ref 0.0–30.0)
Microalb, Ur: 0.8 mg/dL (ref 0.0–1.9)

## 2019-07-23 LAB — COMPREHENSIVE METABOLIC PANEL
ALT: 21 U/L (ref 0–53)
AST: 18 U/L (ref 0–37)
Albumin: 4.3 g/dL (ref 3.5–5.2)
Alkaline Phosphatase: 56 U/L (ref 39–117)
BUN: 17 mg/dL (ref 6–23)
CO2: 28 mEq/L (ref 19–32)
Calcium: 9.5 mg/dL (ref 8.4–10.5)
Chloride: 103 mEq/L (ref 96–112)
Creatinine, Ser: 1.44 mg/dL (ref 0.40–1.50)
GFR: 50.72 mL/min — ABNORMAL LOW (ref >60.00)
Glucose, Bld: 149 mg/dL — ABNORMAL HIGH (ref 70–99)
Potassium: 4 mEq/L (ref 3.5–5.1)
Sodium: 139 mEq/L (ref 135–145)
Total Bilirubin: 0.4 mg/dL (ref 0.2–1.2)
Total Protein: 7.1 g/dL (ref 6.0–8.3)

## 2019-07-23 LAB — LIPID PANEL
Cholesterol: 126 mg/dL (ref 0–200)
HDL: 54 mg/dL (ref >39.00)
LDL Cholesterol: 40 mg/dL (ref 0–99)
NonHDL: 72.43
Total CHOL/HDL Ratio: 2
Triglycerides: 164 mg/dL — ABNORMAL HIGH (ref 0.0–149.0)
VLDL: 32.8 mg/dL (ref 0.0–40.0)

## 2019-09-23 ENCOUNTER — Other Ambulatory Visit: Payer: Self-pay | Admitting: Endocrinology

## 2019-10-01 ENCOUNTER — Other Ambulatory Visit: Payer: Self-pay | Admitting: Endocrinology

## 2019-10-22 ENCOUNTER — Other Ambulatory Visit: Payer: Self-pay | Admitting: Endocrinology

## 2019-10-31 ENCOUNTER — Ambulatory Visit: Payer: BLUE CROSS/BLUE SHIELD | Admitting: Endocrinology

## 2019-11-16 ENCOUNTER — Other Ambulatory Visit: Payer: Self-pay | Admitting: Endocrinology

## 2019-11-17 ENCOUNTER — Telehealth: Payer: Self-pay

## 2019-11-17 NOTE — Telephone Encounter (Signed)
Called pt and left detailed voicemail with MD message and requested that he call the office back and schedule a f/u visit with labs before April, if he has started taking [place].

## 2019-11-17 NOTE — Telephone Encounter (Signed)
-----   Message from Reather Littler, MD sent at 11/16/2019  6:11 PM EST ----- Regarding: Christian Yang Please find out if patient started [place] after his last visit.  Also needs labs and office visit sooner than April if he has started this

## 2019-11-28 ENCOUNTER — Other Ambulatory Visit: Payer: Self-pay | Admitting: Endocrinology

## 2019-12-01 ENCOUNTER — Other Ambulatory Visit: Payer: Self-pay

## 2019-12-01 ENCOUNTER — Other Ambulatory Visit: Payer: Self-pay | Admitting: Endocrinology

## 2019-12-01 MED ORDER — V-GO 20 KIT: 1.0000 | PACK | Freq: Every day | 1 refills | Status: DC

## 2019-12-11 ENCOUNTER — Other Ambulatory Visit: Payer: Self-pay | Admitting: Endocrinology

## 2019-12-15 ENCOUNTER — Other Ambulatory Visit: Payer: Self-pay | Admitting: Endocrinology

## 2019-12-21 ENCOUNTER — Other Ambulatory Visit: Payer: Self-pay | Admitting: Endocrinology

## 2019-12-27 ENCOUNTER — Other Ambulatory Visit: Payer: Self-pay | Admitting: Endocrinology

## 2020-01-12 ENCOUNTER — Telehealth: Payer: Self-pay

## 2020-01-12 NOTE — Telephone Encounter (Signed)
LVM requesting patient call back to reschedule his 01/23/20 appointment-we will be out of the office

## 2020-01-23 ENCOUNTER — Ambulatory Visit: Payer: BLUE CROSS/BLUE SHIELD | Admitting: Endocrinology

## 2020-02-09 ENCOUNTER — Other Ambulatory Visit: Payer: Self-pay

## 2020-02-09 ENCOUNTER — Ambulatory Visit: Payer: BLUE CROSS/BLUE SHIELD | Admitting: Endocrinology

## 2020-02-09 ENCOUNTER — Encounter: Payer: Self-pay | Admitting: Endocrinology

## 2020-02-09 VITALS — BP 142/80 | HR 85 | Ht 73.0 in | Wt 221.8 lb

## 2020-02-09 DIAGNOSIS — N289 Disorder of kidney and ureter, unspecified: Secondary | ICD-10-CM

## 2020-02-09 DIAGNOSIS — Z794 Long term (current) use of insulin: Secondary | ICD-10-CM | POA: Diagnosis not present

## 2020-02-09 DIAGNOSIS — E1165 Type 2 diabetes mellitus with hyperglycemia: Secondary | ICD-10-CM | POA: Diagnosis not present

## 2020-02-09 DIAGNOSIS — E7800 Pure hypercholesterolemia, unspecified: Secondary | ICD-10-CM | POA: Diagnosis not present

## 2020-02-09 LAB — POCT GLYCOSYLATED HEMOGLOBIN (HGB A1C): Hemoglobin A1C: 7.5 % — AB (ref 4.0–5.6)

## 2020-02-09 LAB — GLUCOSE, POCT (MANUAL RESULT ENTRY): POC Glucose: 236 mg/dl — AB (ref 70–99)

## 2020-02-09 NOTE — Progress Notes (Signed)
Patient ID: Christian Yang, male   DOB: Feb 02, 1961, 59 y.o.   MRN: [id number]           Reason for Appointment: Follow-up for Type 2 Diabetes   History of Present Illness:          Date of diagnosis of type 2 diabetes mellitus:   2008       Background history:   He apparently had a weight of 285 pounds at diagnosis He was having dry mouth and flulike symptoms at onset and glucose was 672 He was started on metformin is still taking Not clear what other medications he has been taking but he has been on Jardiance for 2 years and Bydureon since 6/17 Records indicated that his A1c has been 10-11+ since at least 2015 Levemir insulin was started 3-4 years prior to his consultation but without adequate control   He was started on the V-go pump in 04/2015  Recent history:   INSULIN regimen is: Insulin pump, V-go 20 basal, mealtime clicks 4 at breakfast and 3-4 clicks at  lunch and 4-6 at dinner  Non-insulin hypoglycemic drugs the patient is taking are: metformin 1 g a.m.--0.5 p.m.  He has not been seen in follow-up since 9/20  Current management, blood sugar patterns and problems identified:  A1c is now 7.5 compared to 8 last time   He has falsely low readings with his freestyle Elenor Legato  He has refused to use the Medtronic pump which he had obtained since he found it difficult and also was concerned about the alarms at night  He has only rarely checked his fingersticks to compare his freestyle libre and generally feels that fingersticks are higher  Today blood sugar was 236 with fingerstick and his libre read 178 postprandially  He was advised [place] on the last visit were improving control but he did not get the prescription filled  He still has according to his Elenor Legato some tendency to relatively low sugars overnight  Generally he usually does not feel the symptoms of hypoglycemia until blood sugars are in the 50s  He is active at work and during good weather days he will be  active on weekends also  His weight appears to be now leveled off  He will usually take his BOLUSES right before eating  Based on his intake he will have high postprandial readings after breakfast or dinner  Sometimes he will take extra insulin when the sugar goes up after eating and has had a couple of relatively low sugar late at night from this  Side effects from diabetes medications have been: None  Glucose monitoring:       Glucometer:  Freestyle Libre      CONTINUOUS GLUCOSE MONITORING RECORD INTERPRETATION    Dates of Recording: 4/6 -4/19  Sensor description: Elenor Legato  Results statistics:   CGM use % of time  95  Average and GV  144+/-43  Time in range      66%  % Time Above 180  18  % Time above 250  7  % Time Below target  9    PRE-MEAL Fasting Lunch Dinner Bedtime Overall  Glucose range:       Mean/median:  122  110  131   144   POST-MEAL PC Breakfast PC Lunch PC Dinner  Glucose range:     Mean/median:  189  142  178    Glycemic patterns summary: Blood sugars are similar to previous visit with about the same average  and time in range His blood sugars are falsely low although he does not check his blood sugars with fingerstick very much Again he has tendency to prevent low sugars before mealtimes Hyperglycemia is present especially after breakfast and to variable degrees after dinner  Hyperglycemic episodes are occurring frequently after breakfast with blood sugars as high as 300 and occasionally in the afternoon or after dinner  Hypoglycemic episodes occurred usually transiently, before lunch and dinner and sometimes at bedtime. Overnight blood sugars occurred only twice, mostly on the night of the 16th May have low sugars after 10 PM possibly from overcorrection of high readings  Overnight periods: Blood sugars are variably higher at bedtime and then gradually decline until about 5 AM Hypoglycemia as above only 3 nights with lowest reading 49  Preprandial  periods: Blood sugars are averaging about 120 with variability in the mornings, usually near normal at lunchtime Blood sugars before dinnertime are more variable and occasionally mildly low  Postprandial periods:   After breakfast: Usually has significant hypoglycemia in the morning after breakfast especially right after eating  After lunch:   Blood sugars are mostly fairly well controlled except occasionally on weekends After dinner: Evening blood sugars are high about half the time      CGM use % of time  98  Average and SD  142+/-41  Time in range       69% was 58  % Time Above 180  19  % Time above 250  4  % Time Below target  8    Glycemic patterns summary: Blood sugars are showing moderate fluctuating patterns throughout the day He has variable stenosis.  Low normal or low sugars late morning and before lunch and also occasionally before and after supper. Has had variable degree of hyperglycemia after meals, least frequently after lunch  Hyperglycemic episodes are occurring mostly to a variable extent right after breakfast or dinner and occasionally late at night.  Had hyperglycemia last Sunday throughout the day for about 24 hours  Hypoglycemic episodes occurred a couple of times early morning, periodically late morning or before lunchtime, infrequently before dinner and once after dinner    PRE-MEAL Fasting Lunch Dinner Bedtime Overall  Glucose range:       Mean/median:  119  124  149   142   POST-MEAL PC Breakfast PC Lunch PC Dinner  Glucose range:     Mean/median:  153  145  169      Self-care: The diet that the patient has been following is: tries to limit High-fat foods .     Typical meal intake: Breakfast is cereal, sometimes oatmeal or biscuits, lunch is a sandwich and fruit. Lunch 1 pm Dinner is baked chicken, salad and rice at 7 pm .   Snacks are on Peanut butter and crackers, having this at 10. 30 a.m. at work                Dietician visit, most  recent: At diagnosis only CDE visit: 7/17               Exercise: On his feet at work.    Weight history: Maximum 285  Wt Readings from Last 3 Encounters:  02/09/20 221 lb 12.8 oz (100.6 kg)  07/22/19 220 lb 9.6 oz (100.1 kg)  12/13/18 213 lb 12.8 oz (97 kg)    Glycemic control:   Lab Results  Component Value Date   HGBA1C 7.5 (A) 02/09/2020   HGBA1C 8.0 (  A) 07/22/2019   HGBA1C 7.4 (A) 12/13/2018   Lab Results  Component Value Date   MICROALBUR 0.8 07/22/2019   LDLCALC 40 07/22/2019   CREATININE 1.44 07/22/2019   Lab Results  Component Value Date   MICRALBCREAT 0.4 07/22/2019       Allergies as of 02/09/2020      Reactions   Penicillins Rash      Medication List       Accurate as of February 09, 2020  3:32 PM. If you have any questions, ask your nurse or doctor.        FreeStyle Libre 14 Day Reader Kerrin Mo by Does not apply route.   FreeStyle Libre 14 Day Sensor Misc APPLY 1 SENSOR TO BODY ONCE EVERY 14 DAYS TO MONITOR BLOOD SUGAR.   glucose blood test strip Commonly known as: Contour Next Test 1 each by Other route as needed for other. Check blood sugar before all meals and at HS. Check 2 hours after a meal 5 times per week.   glucose blood test strip Commonly known as: FreeStyle Precision Neo Test Use as instructed to check blood sugar 3 times daily.   Contour Next Test test strip Generic drug: glucose blood CHECK BLOOD SUGAR BEFORE ALL MEALS AND AT BEDTIME. CHECK 2 HOURS AFTER A MEAL 5 TIMES PER WEEK.   insulin aspart 100 UNIT/ML injection Commonly known as: NovoLOG USE MAX 65 UNITS WITH V-GO INSULIN PUMP DAILY   metFORMIN 1000 MG tablet Commonly known as: GLUCOPHAGE TAKE 1 TABLET BY MOUTH TWICE DAILY WITH MEALS   omeprazole 40 MG capsule Commonly known as: PRILOSEC Take 40 mg by mouth daily.   OneTouch Delica Lancets 81X Misc Use to check blood sugar 3 times per day.   rosuvastatin 5 MG tablet Commonly known as: CRESTOR TAKE 1 TABLET BY  MOUTH EVERY DAY   V-Go 20 Kit USE AS DIRECTED ONCE DAILY.       Allergies:  Allergies  Allergen Reactions  . Penicillins Rash    Past Medical History:  Diagnosis Date  . Diabetes mellitus without complication ([place])   . GERD (gastroesophageal reflux disease)   . High cholesterol   . History of kidney stones     Past Surgical History:  Procedure Laterality Date  . CARPAL TUNNEL RELEASE Bilateral    10 +years ago  . ESOPHAGOGASTRODUODENOSCOPY  2000   stretching of esophagus  . EXTRACORPOREAL SHOCK WAVE LITHOTRIPSY Right 04/08/2018   Procedure: RIGHT EXTRACORPOREAL SHOCK WAVE LITHOTRIPSY (ESWL);  Surgeon: Cleon Gustin, MD;  Location: [facility];  Service: Urology;  Laterality: Right;    Family History  Problem Relation Age of Onset  . Heart disease Mother   . Diabetes Mother   . Diabetes Sister   . Diabetes Maternal Grandmother     Social History:  reports that he has never smoked. He has never used smokeless tobacco. He reports that he does not drink alcohol or use drugs.   Review of Systems     Lipid history: LDL usually controlled with Crestor 5 mg    Lab Results  Component Value Date   CHOL 126 07/22/2019   HDL 54.00 07/22/2019   LDLCALC 40 07/22/2019   TRIG 164.0 (H) 07/22/2019   CHOLHDL 2 07/22/2019           Hypertension: Not on any treatment, blood pressure is variable in the office Has not checked recently at home  BP Readings from Last 3 Encounters:  02/09/20 (!) 142/80  07/22/19 Marland Kitchen)  150/86  12/13/18 140/80     Previously renal function  abnormal without diabetic nephropathy with variable levels  Lab Results  Component Value Date   CREATININE 1.44 07/22/2019   CREATININE 1.32 05/24/2018   CREATININE 1.42 (H) 04/02/2018     Most recent eye exam was 6/18 and he has not scheduled follow-up  Most recent foot exam: 9/20   LABS:  Office Visit on 02/09/2020  Component Date Value Ref Range Status  . Hemoglobin A1C 02/09/2020 7.5*  4.0 - 5.6 % Final    Physical Examination:  BP (!) 142/80 (BP Location: Right Arm, Patient Position: Sitting, Cuff Size: Large)   Pulse 85   Ht '6\' 1"'  (1.854 m)   Wt 221 lb 12.8 oz (100.6 kg)   SpO2 95%   BMI 29.26 kg/m       ASSESSMENT:  Diabetes type 2, uncontrolled    See history of present illness for detailed discussion of current diabetes management, blood sugar patterns and problems identified  He has had long-standing diabetes, on basal bolus insulin and metformin  A1c is 7.5  As discussed above he can have better control of his postprandial readings and also likely avoid low blood sugars with more appropriate bolusing as well as having flexibility to adjust his basal rate on a different pump instead of the V-go that he is using  LIPIDS: Well-controlled as of 9/20 and will recheck on the next visit  PLAN:     He will look at the cost of using the OmniPod pump  Recommended that he start using Dexcom if it is covered by insurance  Otherwise he will need to use the version 2 freestyle libre for better accuracy  Bolus 15 minutes before eating  Will not start [place] until he is on OmniPod pump  Discussed that this may help with blood pressure and weight loss also  Once he is about to finish his NovoLog he can switch to [place] for better postprandial control and shorter duration of action  More regular follow-up  Check renal function today  Advised him to check blood pressure regularly at home and call if consistently high  He needs to schedule an eye appointment     There are no Patient Instructions on file for this visit.    Elayne Snare 02/09/2020, 3:32 PM   Note: This office note was prepared with Dragon voice recognition system technology. Any transcriptional errors that result from this process are unintentional.

## 2020-02-09 NOTE — Patient Instructions (Addendum)
Check on Dexcom coverage  Or can get  version 2 of Libre   Take clicks up to 15 min before the meal  Check BP  Eye exam

## 2020-02-11 ENCOUNTER — Other Ambulatory Visit: Payer: Self-pay | Admitting: Endocrinology

## 2020-02-14 ENCOUNTER — Other Ambulatory Visit: Payer: Self-pay | Admitting: Endocrinology

## 2020-02-17 ENCOUNTER — Telehealth: Payer: Self-pay | Admitting: Endocrinology

## 2020-02-17 NOTE — Telephone Encounter (Signed)
Omnipod called checking the status of the PA that was faxed on 02/12/20 for this patient.   Melanie Crazier at [facility] # [phone]

## 2020-02-18 NOTE — Telephone Encounter (Signed)
MD has all paperwork and will be completed as time allows for MD.

## 2020-02-26 ENCOUNTER — Other Ambulatory Visit: Payer: Self-pay

## 2020-02-26 MED ORDER — OMNIPOD DASH PODS (GEN 4) MISC: 2 refills | Status: DC

## 2020-02-26 NOTE — Telephone Encounter (Signed)
Rx sent 

## 2020-02-26 NOTE — Telephone Encounter (Signed)
Omnipod called stating patient would like Omnipod RX sent to   [id number] - [place], [place] - [address] Phone:  [phone]  Fax:  [phone]

## 2020-03-03 ENCOUNTER — Other Ambulatory Visit: Payer: Self-pay | Admitting: Endocrinology

## 2020-03-23 ENCOUNTER — Other Ambulatory Visit: Payer: Self-pay | Admitting: Endocrinology

## 2020-03-25 NOTE — Telephone Encounter (Signed)
Received fax from [facility] stating that the pt has been approved for Omnipod Dash 5 pack pods.  Effective authorization dates are 02/19/2020 through 02/17/2021 Reference number is [id number] Place of service: Pharmacy.

## 2020-03-31 ENCOUNTER — Telehealth: Payer: Self-pay | Admitting: Endocrinology

## 2020-03-31 NOTE — Telephone Encounter (Signed)
Bonita Quin, Dr. [name] has availability next and the following. Just let me know when you can get the pt in to see you and I will add him to Dr. Remus Blake schedule.

## 2020-03-31 NOTE — Telephone Encounter (Signed)
His OmniPod pump was approved over a month ago.  Linda please call him to schedule pump training

## 2020-04-07 ENCOUNTER — Telehealth: Payer: Self-pay | Admitting: Nutrition

## 2020-04-07 NOTE — Telephone Encounter (Signed)
Message left to call me to set up an appointment for pump start.  Told him we are both available next week to schedule him on Monday, and Dr. [name] on Tues.-Thurs.  Telephone number left to call me back

## 2020-04-13 ENCOUNTER — Telehealth: Payer: Self-pay | Admitting: Nutrition

## 2020-04-13 NOTE — Telephone Encounter (Signed)
Message left on machine to call to schedule pump start appointment

## 2020-04-13 NOTE — Telephone Encounter (Signed)
Message left to call me to schedule pump start. 

## 2020-04-27 NOTE — Telephone Encounter (Signed)
Message left on machine that this is the third and final call to try to schedule a pump start order.  Number left to call me.

## 2020-05-05 ENCOUNTER — Other Ambulatory Visit: Payer: Self-pay | Admitting: Endocrinology

## 2020-05-14 ENCOUNTER — Ambulatory Visit: Payer: BLUE CROSS/BLUE SHIELD | Admitting: Endocrinology

## 2020-05-14 ENCOUNTER — Encounter: Payer: Self-pay | Admitting: Endocrinology

## 2020-05-14 ENCOUNTER — Other Ambulatory Visit: Payer: Self-pay

## 2020-05-14 ENCOUNTER — Telehealth: Payer: Self-pay | Admitting: *Deleted

## 2020-05-14 VITALS — BP 140/82 | HR 74 | Ht 73.0 in | Wt 222.2 lb

## 2020-05-14 DIAGNOSIS — E1165 Type 2 diabetes mellitus with hyperglycemia: Secondary | ICD-10-CM | POA: Diagnosis not present

## 2020-05-14 DIAGNOSIS — Z794 Long term (current) use of insulin: Secondary | ICD-10-CM

## 2020-05-14 DIAGNOSIS — E7800 Pure hypercholesterolemia, unspecified: Secondary | ICD-10-CM

## 2020-05-14 LAB — POCT GLYCOSYLATED HEMOGLOBIN (HGB A1C): Hemoglobin A1C: 7.7 % — AB (ref 4.0–5.6)

## 2020-05-14 NOTE — Telephone Encounter (Signed)
Medication Sample . Pt came into office for an appointment and was given a sample of Fiasp 100 UNITS/ML (U100)per Dr. Remus Blake instructions on 05/14/20 . Medication was signed out according to sample medication policy.  . Medication was labeled using approved label. . Medication sample was added to patient's medication list as a sample medication that had been given. . Medication was explained to patient and patient verbalized understanding of how to take properly.  . Pt was also instructed to call this office if any questions or concerns arise. Pt did verbalize understanding of this.  Lot number for medication is: [id number] Expiration for medication is: 03/2019

## 2020-05-14 NOTE — Progress Notes (Signed)
Patient ID: Christian Yang, male   DOB: 1961/08/04, 59 y.o.   MRN: [id number]           Reason for Appointment: Follow-up for Type 2 Diabetes   History of Present Illness:          Date of diagnosis of type 2 diabetes mellitus:   2008       Background history:   He apparently had a weight of 285 pounds at diagnosis He was having dry mouth and flulike symptoms at onset and glucose was 672 He was started on metformin is still taking Not clear what other medications he has been taking but he has been on Jardiance for 2 years and Bydureon since 6/17 Records indicated that his A1c has been 10-11+ since at least 2015 Levemir insulin was started 3-4 years prior to his consultation but without adequate control   He was started on the V-go pump in 04/2015  Recent history:   INSULIN regimen is: Insulin pump, V-go 20 basal, mealtime clicks 4 at breakfast and 3-4 clicks at  lunch and 4-6 at dinner  Non-insulin hypoglycemic drugs the patient is taking are: metformin 1 g a.m.--0.5 p.m.  Current management, blood sugar patterns and problems identified:  A1c is now 7.7 compared to 7.5   Freestyle libre is falsely low with his average blood sugar recently indicating a GMI of 6.8 compared to his A1c about 1% higher   He has not wanted to start the OmniPod pump because of higher out-of-pocket expense  However he still continues to have HYPOGLYCEMIA frequently midmorning although sometimes may not be symptomatic and freestyle Elenor Legato is difficult to analyze since it is sometimes falsely low  There is still using 5 clicks at breakfast even though blood sugar may be lowered by lunchtime and today when around 8:30 AM  Bolusing right before eating  Also blood sugars show variability and some tendency to high readings overnight also  Side effects from diabetes medications have been: None  Glucose monitoring:       Glucometer:  Freestyle Libre      CONTINUOUS GLUCOSE MONITORING RECORD  INTERPRETATION    Dates of Recording: Last 2 weeks  Sensor description: Elenor Legato  Results statistics:   CGM use % of time  96  Average and SD  145+/-35  Time in range  70     %  % Time Above 180 21  % Time above 250   % Time Below target  6    PRE-MEAL Fasting Lunch Dinner Bedtime Overall  Glucose range:       Mean/median:  137  105  142   145   POST-MEAL PC Breakfast PC Lunch PC Dinner  Glucose range:     Mean/median:  166  167  171    Glycemic patterns summary:   Hyperglycemic episodes are occurring to a variable extent after meals with most significant rise after lunch although on an average blood sugars are the highest after dinner averaging 170+  Hypoglycemic episodes occurred mostly before lunchtime starting around 8 AM until about 1 PM.  These are not occurring on weekends and occurred at least twice this week and 4 times the previous week  Overnight periods: Blood sugars are mostly high with average to 164 with some variability  Preprandial periods: Blood sugars are modestly high at breakfast, usually low or low normal at lunchtime and mildly increased at dinnertime  Postprandial periods:   After breakfast: Rise in blood sugar is on  an average acceptable within 40 mg    After lunch:   Blood sugars are rising about 50 to 60 mg After dinner: Blood sugar rises only about 40 mg but showing significant variability, occasionally much higher on weekends      CGM use % of time  95  Average and GV  144+/-43  Time in range      66%  % Time Above 180  18  % Time above 250  7  % Time Below target  9    PRE-MEAL Fasting Lunch Dinner Bedtime Overall  Glucose range:       Mean/median:  122  110  131   144   POST-MEAL PC Breakfast PC Lunch PC Dinner  Glucose range:     Mean/median:  189  142  178    Glycemic patterns summary: Blood sugars are similar to previous visit with about the same average and time in range His blood sugars are falsely low although he does not  check his blood sugars with fingerstick very much Again he has tendency to prevent low sugars before mealtimes Hyperglycemia is present especially after breakfast and to variable degrees after dinner     Self-care: The diet that the patient has been following is: tries to limit High-fat foods .     Typical meal intake: Breakfast is cereal, sometimes oatmeal or biscuits, lunch is a sandwich and fruit. Lunch 1 pm Dinner is baked chicken, salad and rice at 7 pm .   Snacks are on Peanut butter and crackers, having this at 10. 30 a.m. at work                Dietician visit, most recent: At diagnosis only CDE visit: 7/17               Exercise: On his feet at work.    Weight history: Maximum 285  Wt Readings from Last 3 Encounters:  05/14/20 (!) 222 lb 3.2 oz (100.8 kg)  02/09/20 221 lb 12.8 oz (100.6 kg)  07/22/19 220 lb 9.6 oz (100.1 kg)    Glycemic control:   Lab Results  Component Value Date   HGBA1C 7.7 (A) 05/14/2020   HGBA1C 7.5 (A) 02/09/2020   HGBA1C 8.0 (A) 07/22/2019   Lab Results  Component Value Date   MICROALBUR 0.8 07/22/2019   LDLCALC 55 05/14/2020   CREATININE 1.31 (H) 05/14/2020   Lab Results  Component Value Date   MICRALBCREAT 6 05/14/2020       Allergies as of 05/14/2020      Reactions   Penicillins Rash      Medication List       Accurate as of May 14, 2020 11:59 PM. If you have any questions, ask your nurse or doctor.        FreeStyle Libre 14 Day Reader Kerrin Mo by Does not apply route.   FreeStyle Libre 14 Day Sensor Misc APPLY 1 SENSOR TO BODY ONCE EVERY 14 DAYS TO MONITOR BLOOD SUGAR.   glucose blood test strip Commonly known as: Contour Next Test 1 each by Other route as needed for other. Check blood sugar before all meals and at HS. Check 2 hours after a meal 5 times per week.   glucose blood test strip Commonly known as: FreeStyle Precision Neo Test Use as instructed to check blood sugar 3 times daily.   Contour Next Test  test strip Generic drug: glucose blood CHECK BLOOD SUGAR BEFORE ALL MEALS AND AT BEDTIME.  CHECK 2 HOURS AFTER A MEAL 5 TIMES PER WEEK.   insulin aspart 100 UNIT/ML injection Commonly known as: NovoLOG USE MAX 65 UNITS WITH V-GO INSULIN PUMP DAILY   metFORMIN 1000 MG tablet Commonly known as: GLUCOPHAGE Take 1 tablet (1042m total) by mouth in the morning and 1/2 tablet in the evening.   omeprazole 40 MG capsule Commonly known as: PRILOSEC Take 40 mg by mouth daily.   OmniPod Dash 5 Pack Pods Misc Apply one pod to body once every 3 days for insulin delivery.   V-Go 20 Kit USE AS DIRECTED ONCE DAILY   OneTouch Delica Lancets 377LMisc Use to check blood sugar 3 times per day.   rosuvastatin 5 MG tablet Commonly known as: CRESTOR TAKE 1 TABLET BY MOUTH EVERY DAY       Allergies:  Allergies  Allergen Reactions  . Penicillins Rash    Past Medical History:  Diagnosis Date  . Diabetes mellitus without complication ([place]   . GERD (gastroesophageal reflux disease)   . High cholesterol   . History of kidney stones     Past Surgical History:  Procedure Laterality Date  . CARPAL TUNNEL RELEASE Bilateral    10 +years ago  . ESOPHAGOGASTRODUODENOSCOPY  2000   stretching of esophagus  . EXTRACORPOREAL SHOCK WAVE LITHOTRIPSY Right 04/08/2018   Procedure: RIGHT EXTRACORPOREAL SHOCK WAVE LITHOTRIPSY (ESWL);  Surgeon: MCleon Gustin MD;  Location: [facility];  Service: Urology;  Laterality: Right;    Family History  Problem Relation Age of Onset  . Heart disease Mother   . Diabetes Mother   . Diabetes Sister   . Diabetes Maternal Grandmother     Social History:  reports that he has never smoked. He has never used smokeless tobacco. He reports that he does not drink alcohol and does not use drugs.   Review of Systems     Lipid history: LDL usually controlled with Crestor 5 mg    Lab Results  Component Value Date   CHOL 127 05/14/2020   HDL 59 05/14/2020    LDLCALC 55 05/14/2020   TRIG 64 05/14/2020   CHOLHDL 2.2 05/14/2020           Hypertension: Not on any treatment, blood pressure is variable in the office Has not checked recently at home  BP Readings from Last 3 Encounters:  05/14/20 (!) 140/82  02/09/20 (!) 142/80  07/22/19 (!) 150/86     Previously renal function  abnormal without diabetic nephropathy with variable levels  Lab Results  Component Value Date   CREATININE 1.31 (H) 05/14/2020   CREATININE 1.44 07/22/2019   CREATININE 1.32 05/24/2018     Most recent eye exam was 6/18 and he has not scheduled follow-up  Most recent foot exam: 9/20  He has not had his Covid vaccine  LABS:  Office Visit on 05/14/2020  Component Date Value Ref Range Status  . Hemoglobin A1C 05/14/2020 7.7* 4.0 - 5.6 % Final  . Glucose 05/14/2020 277* 65 - 99 mg/dL Final  . BUN 05/14/2020 16  6 - 24 mg/dL Final  . Creatinine, Ser 05/14/2020 1.31* 0.76 - 1.27 mg/dL Final  . GFR calc non Af Amer 05/14/2020 60  >59 mL/min/1.73 Final  . GFR calc Af Amer 05/14/2020 70  >59 mL/min/1.73 Final   Comment: **[facility] currently reports eGFR in compliance with the current**   recommendations of the [employer] [facility] will   update reporting as new guidelines are published from the NKF-ASN  Task force.   . BUN/Creatinine Ratio 05/14/2020 12  9 - 20 Final  . Sodium 05/14/2020 140  134 - 144 mmol/L Final  . Potassium 05/14/2020 4.2  3.5 - 5.2 mmol/L Final  . Chloride 05/14/2020 103  96 - 106 mmol/L Final  . CO2 05/14/2020 24  20 - 29 mmol/L Final  . Calcium 05/14/2020 9.2  8.7 - 10.2 mg/dL Final  . Cholesterol, Total 05/14/2020 127  100 - 199 mg/dL Final  . Triglycerides 05/14/2020 64  0 - 149 mg/dL Final  . HDL 05/14/2020 59  >39 mg/dL Final  . VLDL Cholesterol Cal 05/14/2020 13  5 - 40 mg/dL Final  . LDL Chol Calc (NIH) 05/14/2020 55  0 - 99 mg/dL Final  . Chol/HDL Ratio 05/14/2020 2.2  0.0 - 5.0 ratio Final   Comment:                                    T. Chol/HDL Ratio                                             Men  Women                               1/2 Avg.Risk  3.4    3.3                                   Avg.Risk  5.0    4.4                                2X Avg.Risk  9.6    7.1                                3X Avg.Risk 23.4   11.0   . Creatinine, Urine 05/14/2020 188.9  Not Estab. mg/dL Final  . Microalbumin, Urine 05/14/2020 10.5  Not Estab. ug/mL Final  . Microalb/Creat Ratio 05/14/2020 6  0 - 29 mg/g creat Final   Comment:                        Normal:                0 -  29                        Moderately increased: 30 - 300                        Severely increased:       >300     Physical Examination:  BP (!) 140/82 (BP Location: Left Arm, Patient Position: Sitting, Cuff Size: Large)   Pulse 74   Ht '6\' 1"'  (1.854 m)   Wt (!) 222 lb 3.2 oz (100.8 kg)   SpO2 93%   BMI 29.32 kg/m       ASSESSMENT:  Diabetes type 2, uncontrolled    See history of present illness for detailed discussion of current  diabetes management, blood sugar patterns and problems identified  He has had long-standing diabetes, on basal bolus insulin and metformin  A1c is 7.7  He still has only fair control with his V-go pump Discussed in detail where he is having high blood sugars especially overnight and variably after evening meals as well as tendency to low sugars when he is more active at work during the morning hours Explained to him that the V-go pump is not able to adjust his basal rate and his OmniPod should be able to do so much more effectively Also can be more precise with his carbohydrate coverage and corrections with the OmniPod Currently he is not willing to consider the higher cost  LIPIDS: Needs follow-up  Blood pressure: This is borderline high, not clear if this is from traveling to [place] and he will follow-up with his PCP  PLAN:    He again consider the advantages of the OmniPod pump  and given him sample pod to take home He will call if he is deciding to switch Reduce boluses to 4 clicks at breakfast especially on weekdays when he is active Midmorning snack before 10 AM Bolus 15 minutes before eating especially if blood sugars are not near normal Continue to follow borderline renal function   There are no Patient Instructions on file for this visit.    Elayne Snare 05/16/2020, 8:45 PM   Note: This office note was prepared with Dragon voice recognition system technology. Any transcriptional errors that result from this process are unintentional.  Addendum LDL is controlled, creatinine 1.3, slightly better

## 2020-05-14 NOTE — Addendum Note (Signed)
Addended by: Adline Mango I on: 05/14/2020 03:35 PM   Modules accepted: Orders

## 2020-05-15 LAB — LIPID PANEL
Chol/HDL Ratio: 2.2 ratio (ref 0.0–5.0)
Cholesterol, Total: 127 mg/dL (ref 100–199)
HDL: 59 mg/dL (ref >39)
LDL Chol Calc (NIH): 55 mg/dL (ref 0–99)
Triglycerides: 64 mg/dL (ref 0–149)
VLDL Cholesterol Cal: 13 mg/dL (ref 5–40)

## 2020-05-15 LAB — BASIC METABOLIC PANEL
BUN/Creatinine Ratio: 12 (ref 9–20)
BUN: 16 mg/dL (ref 6–24)
CO2: 24 mmol/L (ref 20–29)
Calcium: 9.2 mg/dL (ref 8.7–10.2)
Chloride: 103 mmol/L (ref 96–106)
Creatinine, Ser: 1.31 mg/dL — ABNORMAL HIGH (ref 0.76–1.27)
GFR calc Af Amer: 70 mL/min/{1.73_m2} (ref >59)
GFR calc non Af Amer: 60 mL/min/{1.73_m2} (ref >59)
Glucose: 277 mg/dL — ABNORMAL HIGH (ref 65–99)
Potassium: 4.2 mmol/L (ref 3.5–5.2)
Sodium: 140 mmol/L (ref 134–144)

## 2020-05-15 LAB — MICROALBUMIN / CREATININE URINE RATIO
Creatinine, Urine: 188.9 mg/dL
Microalb/Creat Ratio: 6 mg/g creat (ref 0–29)
Microalbumin, Urine: 10.5 ug/mL

## 2020-05-16 NOTE — Progress Notes (Signed)
Please call to let patient know that the kidney tests are slightly better and cholesterol okay.  Glucose was 277, needs to compare freestyle with fingersticks more often.  If he is not getting the Dexcom need to consider freestyle libre version 2

## 2020-05-20 ENCOUNTER — Other Ambulatory Visit: Payer: Self-pay

## 2020-05-20 MED ORDER — FREESTYLE LIBRE 2 READER DEVI: 0 refills | Status: DC

## 2020-05-20 MED ORDER — FREESTYLE LIBRE 2 SENSOR MISC: 0 refills | Status: DC

## 2020-05-25 ENCOUNTER — Telehealth: Payer: Self-pay

## 2020-05-25 NOTE — Telephone Encounter (Signed)
APPROVAL  Device: Omnipod Dash 5 Pack Insurance Company: [employer] [place] response: APPROVED Approval dates: 02/19/20 through 02/17/21  Document has been labeled and placed in scan file for HIM and for our future reference.

## 2020-05-26 ENCOUNTER — Other Ambulatory Visit: Payer: Self-pay | Admitting: Endocrinology

## 2020-06-20 ENCOUNTER — Other Ambulatory Visit: Payer: Self-pay | Admitting: Endocrinology

## 2020-06-24 ENCOUNTER — Other Ambulatory Visit: Payer: Self-pay | Admitting: Endocrinology

## 2020-06-28 ENCOUNTER — Other Ambulatory Visit: Payer: Self-pay | Admitting: Endocrinology

## 2020-07-18 ENCOUNTER — Other Ambulatory Visit: Payer: Self-pay | Admitting: Endocrinology

## 2020-08-16 ENCOUNTER — Ambulatory Visit: Payer: BLUE CROSS/BLUE SHIELD | Admitting: Endocrinology

## 2020-08-28 ENCOUNTER — Other Ambulatory Visit: Payer: Self-pay | Admitting: Endocrinology

## 2020-08-31 ENCOUNTER — Other Ambulatory Visit: Payer: Self-pay | Admitting: *Deleted

## 2020-08-31 ENCOUNTER — Telehealth: Payer: Self-pay

## 2020-08-31 MED ORDER — FREESTYLE LIBRE 2 SENSOR MISC
0 refills | Status: DC
Start: 2020-08-31 — End: 2020-10-11

## 2020-08-31 MED ORDER — OMNIPOD DASH PODS (GEN 4) MISC
0 refills | Status: DC
Start: 2020-08-31 — End: 2020-10-04

## 2020-08-31 NOTE — Telephone Encounter (Signed)
New message    The patient is out of Continuous Blood Gluc Sensor (FREESTYLE LIBRE 2 SENSOR) MISC  [facility]

## 2020-08-31 NOTE — Telephone Encounter (Signed)
Rx sent, patient needs f/u appointment.

## 2020-09-24 ENCOUNTER — Other Ambulatory Visit: Payer: Self-pay | Admitting: Endocrinology

## 2020-10-04 ENCOUNTER — Encounter: Payer: Self-pay | Admitting: Endocrinology

## 2020-10-04 ENCOUNTER — Ambulatory Visit: Payer: BLUE CROSS/BLUE SHIELD | Admitting: Endocrinology

## 2020-10-04 ENCOUNTER — Other Ambulatory Visit: Payer: Self-pay

## 2020-10-04 VITALS — BP 162/100 | HR 91 | Ht 73.0 in | Wt 221.8 lb

## 2020-10-04 DIAGNOSIS — N289 Disorder of kidney and ureter, unspecified: Secondary | ICD-10-CM | POA: Diagnosis not present

## 2020-10-04 DIAGNOSIS — Z794 Long term (current) use of insulin: Secondary | ICD-10-CM | POA: Diagnosis not present

## 2020-10-04 DIAGNOSIS — I10 Essential (primary) hypertension: Secondary | ICD-10-CM

## 2020-10-04 DIAGNOSIS — E1165 Type 2 diabetes mellitus with hyperglycemia: Secondary | ICD-10-CM | POA: Diagnosis not present

## 2020-10-04 LAB — COMPREHENSIVE METABOLIC PANEL
ALT: 18 U/L (ref 0–53)
AST: 17 U/L (ref 0–37)
Albumin: 4.5 g/dL (ref 3.5–5.2)
Alkaline Phosphatase: 59 U/L (ref 39–117)
BUN: 23 mg/dL (ref 6–23)
CO2: 29 mEq/L (ref 19–32)
Calcium: 9.9 mg/dL (ref 8.4–10.5)
Chloride: 101 mEq/L (ref 96–112)
Creatinine, Ser: 1.51 mg/dL — ABNORMAL HIGH (ref 0.40–1.50)
GFR: 51.01 mL/min — ABNORMAL LOW (ref >60.00)
Glucose, Bld: 209 mg/dL — ABNORMAL HIGH (ref 70–99)
Potassium: 4.3 mEq/L (ref 3.5–5.1)
Sodium: 137 mEq/L (ref 135–145)
Total Bilirubin: 0.5 mg/dL (ref 0.2–1.2)
Total Protein: 7.4 g/dL (ref 6.0–8.3)

## 2020-10-04 LAB — POCT GLYCOSYLATED HEMOGLOBIN (HGB A1C): Hemoglobin A1C: 7.4 % — AB (ref 4.0–5.6)

## 2020-10-04 MED ORDER — AMLODIPINE BESYLATE 5 MG PO TABS: 5.0000 mg | ORAL_TABLET | Freq: Every day | ORAL | 0 refills | Status: DC

## 2020-10-04 MED ORDER — ROSUVASTATIN CALCIUM 5 MG PO TABS
5.0000 mg | ORAL_TABLET | Freq: Every day | ORAL | 1 refills | Status: DC
Start: 2020-10-04 — End: 2021-04-07

## 2020-10-04 MED ORDER — LOSARTAN POTASSIUM 50 MG PO TABS: 50.0000 mg | ORAL_TABLET | Freq: Every day | ORAL | 0 refills | Status: DC

## 2020-10-04 MED ORDER — INSULIN ASPART 100 UNIT/ML ~~LOC~~ SOLN: SUBCUTANEOUS | 1 refills | Status: DC

## 2020-10-04 NOTE — Progress Notes (Signed)
Patient ID: Christian Yang, male   DOB: 05/24/1961, 59 y.o.   MRN: [id number]           Reason for Appointment: Follow-up for Type 2 Diabetes   History of Present Illness:          Date of diagnosis of type 2 diabetes mellitus:   2008       Background history:   He apparently had a weight of 285 pounds at diagnosis He was having dry mouth and flulike symptoms at onset and glucose was 672 He was started on metformin  He had been on Jardiance for 2 years and Bydureon since 6/17 Records indicated that his A1c has been 10-11+ since at least 2015 Levemir insulin was started 3-4 years prior to his consultation but without adequate control   He was started on the V-go pump in 04/2015  Recent history:   INSULIN regimen is: Insulin pump, V-go 20 basal, mealtime clicks 4 at breakfast and 3-4 clicks at  lunch and 4-6 at dinner  Non-insulin hypoglycemic drugs the patient is taking are: metformin 1 g a.m.--0.5 p.m.  Current management, blood sugar patterns and problems identified:  A1c is now 7.4, was 7.7 on his last visit in 7/21   He has significant variability in his blood sugars as before  Usually taking about the same amount of bolus clicks at her meals and does not adjust much based on meal size or activity level  As before he has still low blood sugars as judged by his freestyle libre had not about the same percentage of readings below 70  However timing target is not as good as last time with higher readings after especially breakfast and dinner  As before he is active at work but is usually able to avoid low sugars with having a snack midmorning  He is reluctant to use the OmniPod pump because of higher out-of-pocket expense compared to the V-go pump  Also his insurance does not cover the version 2 freestyle libre as well  He changes his pump every evening  He feels that he is recognizing hypoglycemia even when this occurs during the night  Side effects from diabetes  medications have been: None  Glucose monitoring:       Glucometer:  Freestyle Libre     Sensor description: Libre  Blood sugar patterns:  Summary: He has a seesaw pattern of high and low sugars with significant hyperglycemia after meals but mostly tending to get sugars below 70 late evening or overnight  Hyperglycemia was more prominent in the first week  Also has hyperglycemia from excessive rebound when he has low normal or low sugars  Premeal blood sugars are generally near 130-140s range  He still thinks he has somewhat lower reading on his libre compared to the actual fingersticks  HYPOGLYCEMIA has occurred more in the last week including more times overnight, twice after 10 PM and rarely before dinner  Overall blood sugars are quite variable but averaging between 130-150 including some low sugars  Results statistics:  CGM use % of time  99  2-week average/SD   Time in range  58     % was 70  % Time Above 180  24  % Time above 250  12  % Time Below 70 6     PRE-MEAL Fasting Lunch Dinner Bedtime Overall  Glucose range:       Averages:  130  137  156  162  162   POST-MEAL  PC Breakfast PC Lunch PC Dinner  Glucose range:     Averages:  190  205  182      CGM use % of time  96  Average and SD  145+/-35  Time in range  70     %  % Time Above 180 21  % Time above 250   % Time Below target  6   PREVIOUS data:  PRE-MEAL Fasting Lunch Dinner Bedtime Overall  Glucose range:       Mean/median:  137  105  142   145   POST-MEAL PC Breakfast PC Lunch PC Dinner  Glucose range:     Mean/median:  166  167  171    Self-care: The diet that the patient has been following is: tries to limit High-fat foods .     Typical meal intake: Breakfast is cereal, sometimes oatmeal or biscuits, lunch is a sandwich and fruit. Lunch 1 pm Dinner is baked chicken, salad and rice at 7 pm .   Snacks are on Peanut butter and crackers, having this at 10. 30 a.m. at work                 Dietician visit, most recent: At diagnosis only CDE visit: 7/17               Exercise: On his feet at work.    Weight history: Maximum 285  Wt Readings from Last 3 Encounters:  10/04/20 221 lb 12.8 oz (100.6 kg)  05/14/20 (!) 222 lb 3.2 oz (100.8 kg)  02/09/20 221 lb 12.8 oz (100.6 kg)    Glycemic control:   Lab Results  Component Value Date   HGBA1C 7.4 (A) 10/04/2020   HGBA1C 7.7 (A) 05/14/2020   HGBA1C 7.5 (A) 02/09/2020   Lab Results  Component Value Date   MICROALBUR 0.8 07/22/2019   LDLCALC 55 05/14/2020   CREATININE 1.51 (H) 10/04/2020   Lab Results  Component Value Date   MICRALBCREAT 6 05/14/2020       Allergies as of 10/04/2020      Reactions   Penicillins Rash      Medication List       Accurate as of October 04, 2020  9:02 PM. If you have any questions, ask your nurse or doctor.        FreeStyle [place] 2 Reader [employer] 2 reader to monitor blood sugar continuously.   FreeStyle Libre 2 Sensor Misc Use one sensor once every 14 days to monitor blood sugar.   glucose blood test strip Commonly known as: Contour Next Test 1 each by Other route as needed for other. Check blood sugar before all meals and at HS. Check 2 hours after a meal 5 times per week.   glucose blood test strip Commonly known as: FreeStyle Precision Neo Test Use as instructed to check blood sugar 3 times daily.   Contour Next Test test strip Generic drug: glucose blood CHECK BLOOD SUGAR BEFORE ALL MEALS AND AT BEDTIME. CHECK 2 HOURS AFTER A MEAL 5 TIMES PER WEEK.   insulin aspart 100 UNIT/ML injection Commonly known as: NovoLOG USE MAX 65 UNITS WITH V-GO INSULIN PUMP DAILY   losartan 50 MG tablet Commonly known as: COZAAR Take 1 tablet (50 mg total) by mouth daily. Started by: Elayne Snare, MD   metFORMIN 1000 MG tablet Commonly known as: GLUCOPHAGE TAKE 1 TABLET BY MOUTH EVERY MORNING AND 1/2 TABLET EVERY EVENING   omeprazole 40 MG  capsule Commonly known as: PRILOSEC Take 40 mg by mouth daily.   OneTouch Delica Lancets 29B Misc Use to check blood sugar 3 times per day.   rosuvastatin 5 MG tablet Commonly known as: CRESTOR TAKE 1 TABLET BY MOUTH EVERY DAY   V-Go 20 Kit USE AS DIRECTED ONCE DAILY What changed: Another medication with the same name was removed. Continue taking this medication, and follow the directions you see here. Changed by: Elayne Snare, MD       Allergies:  Allergies  Allergen Reactions  . Penicillins Rash    Past Medical History:  Diagnosis Date  . Diabetes mellitus without complication ([place])   . GERD (gastroesophageal reflux disease)   . High cholesterol   . History of kidney stones     Past Surgical History:  Procedure Laterality Date  . CARPAL TUNNEL RELEASE Bilateral    10 +years ago  . ESOPHAGOGASTRODUODENOSCOPY  2000   stretching of esophagus  . EXTRACORPOREAL SHOCK WAVE LITHOTRIPSY Right 04/08/2018   Procedure: RIGHT EXTRACORPOREAL SHOCK WAVE LITHOTRIPSY (ESWL);  Surgeon: Cleon Gustin, MD;  Location: [facility];  Service: Urology;  Laterality: Right;    Family History  Problem Relation Age of Onset  . Heart disease Mother   . Diabetes Mother   . Diabetes Sister   . Diabetes Maternal Grandmother     Social History:  reports that he has never smoked. He has never used smokeless tobacco. He reports that he does not drink alcohol and does not use drugs.   Review of Systems     Lipid history: LDL usually controlled with Crestor 5 mg    Lab Results  Component Value Date   CHOL 127 05/14/2020   HDL 59 05/14/2020   LDLCALC 55 05/14/2020   TRIG 64 05/14/2020   CHOLHDL 2.2 05/14/2020           Hypertension: Not on any treatment, blood pressure previously had been variable in the office Has not checked recently at home, previously about 140/80 Last year his PCP had prescribed medication for him but did not start this as blood pressure came down  Today his  blood pressure is persistently high in the office, checked 3 times over a period of time  BP Readings from Last 3 Encounters:  10/04/20 (!) 162/100  05/14/20 (!) 140/82  02/09/20 (!) 142/80     Previously renal function  abnormal without diabetic nephropathy with variable levels  Lab Results  Component Value Date   CREATININE 1.51 (H) 10/04/2020   CREATININE 1.31 (H) 05/14/2020   CREATININE 1.44 07/22/2019     Most recent eye exam was 6/18 and he has not scheduled follow-up  Most recent foot exam: 9/20  He has not had his Covid vaccine and is not willing to do this yet  LABS:  Office Visit on 10/04/2020  Component Date Value Ref Range Status  . Hemoglobin A1C 10/04/2020 7.4* 4.0 - 5.6 % Final  . Sodium 10/04/2020 137  135 - 145 mEq/L Final  . Potassium 10/04/2020 4.3  3.5 - 5.1 mEq/L Final  . Chloride 10/04/2020 101  96 - 112 mEq/L Final  . CO2 10/04/2020 29  19 - 32 mEq/L Final  . Glucose, Bld 10/04/2020 209* 70 - 99 mg/dL Final  . BUN 10/04/2020 23  6 - 23 mg/dL Final  . Creatinine, Ser 10/04/2020 1.51* 0.40 - 1.50 mg/dL Final  . Total Bilirubin 10/04/2020 0.5  0.2 - 1.2 mg/dL Final  . Alkaline Phosphatase 10/04/2020  59  39 - 117 U/L Final  . AST 10/04/2020 17  0 - 37 U/L Final  . ALT 10/04/2020 18  0 - 53 U/L Final  . Total Protein 10/04/2020 7.4  6.0 - 8.3 g/dL Final  . Albumin 10/04/2020 4.5  3.5 - 5.2 g/dL Final  . GFR 10/04/2020 51.01* >60.00 mL/min Final   Calculated using the CKD-EPI Creatinine Equation (2021)  . Calcium 10/04/2020 9.9  8.4 - 10.5 mg/dL Final    Physical Examination:  BP (!) 162/100   Pulse 91   Ht _0  (1.854 m)   Wt 221 lb 12.8 oz (100.6 kg)   SpO2 95%   BMI 29.26 kg/m   Heart rhythm normal, heart sounds normal   ASSESSMENT:  Diabetes type 2, uncontrolled    See history of present illness for detailed discussion of current diabetes management, blood sugar patterns and problems identified  He has had long-standing diabetes,  on basal bolus insulin and metformin  A1c is 7.4  He still inconsistent control with periods of high and low blood sugars with the V-go pump Also has significant variability at all times affected by his meals, snacks, hypoglycemia from current basal rate or when he is more active Also inability to finally adjust his meal boluses with the V-go pump A likely will benefit from a closed-loop insulin pump but he is not able to afford anything other than the V-go pump He may be getting tendency to low sugars overnight with effect of Metformin  LIPIDS: Needs follow-up  Hypertension: Blood pressure is higher than usual He also is having nonspecific symptoms of heart fluttering which may be partly related to high blood pressure   PLAN:   He will stop taking Metformin in the evening He will try to increase his boluses when he is eating a higher fat or high carbohydrate meal especially when he is not working or planning to be active He will check again the coverage for the freestyle libre version 2 is also with the OmniPod 5 when available Avoid over correcting high readings at bedtime Bolus 15 minutes before eating especially if blood sugars high Avoid overtreating low blood sugars He can likely his bolus to cover his meals after eating if he has a Premeal low blood sugar We will need to periodically check fingersticks to compare with freestyle libre  Losartan 50 mg daily  Patient Instructions        Elayne Snare 10/04/2020, 9:02 PM   Note: This office note was prepared with Dragon voice recognition system technology. Any transcriptional errors that result from this process are unintentional.  Addendum: With creatinine going up to 1.5 will change him to amlodipine 5 mg instead of losartan

## 2020-10-06 ENCOUNTER — Telehealth: Payer: Self-pay

## 2020-10-06 NOTE — Telephone Encounter (Signed)
-----   Message from Reather Littler, MD sent at 10/04/2020  9:01 PM EST ----- Kidney function is worse, do not start losartan but change to amlodipine 5 mg daily, new prescription needs to be sent.  Likely kidney function is worse from high blood pressure.  He needs to follow-up with his PCP later this month also

## 2020-10-06 NOTE — Telephone Encounter (Signed)
Left detailed message for patient regarding results and recommendations. 

## 2020-10-11 ENCOUNTER — Other Ambulatory Visit: Payer: Self-pay | Admitting: Endocrinology

## 2020-10-27 ENCOUNTER — Other Ambulatory Visit: Payer: Self-pay | Admitting: Endocrinology

## 2020-12-28 ENCOUNTER — Other Ambulatory Visit: Payer: Self-pay | Admitting: Endocrinology

## 2021-01-05 NOTE — Progress Notes (Signed)
Patient ID: Christian Yang, male   DOB: Feb 22, 1961, 60 y.o.   MRN: [id number]           Reason for Appointment: Follow-up for Type 2 Diabetes   History of Present Illness:          Date of diagnosis of type 2 diabetes mellitus:   2008       Background history:   He apparently had a weight of 285 pounds at diagnosis He was having dry mouth and flulike symptoms at onset and glucose was 672 He was started on metformin  He had been on Jardiance for 2 years and Bydureon since 6/17 Records indicated that his A1c has been 10-11+ since at least 2015 Levemir insulin was started 3-4 years prior to his consultation but without adequate control   He was started on the V-go pump in 04/2015  Recent history:   INSULIN regimen is: Insulin pump, V-go 20 basal, mealtime clicks 3-4 at breakfast and 3-4 clicks at  lunch and 3-4 at dinner  Non-insulin hypoglycemic drugs the patient is taking are: metformin 1 g a.m.--0.5 p.m.  Current management, blood sugar patterns and problems identified:  A1c is now 7 and gradually improving   He has continued variability in his blood sugars at all times including after meals  Currently his freestyle Elenor Legato is relatively accurate although occasionally may have some discrepancy  He was told to stop the evening Metformin to avoid low sugars overnight but he forgot to do that and still has occasional low sugars as low as 47  Some of his hyperglycemia is related to rebound from low sugars  As before he may have a tendency to low sugars are low normal sugars before lunchtime because of being more active  Usually taking about 3 clicks for suppertime but may not be adjusting it on weekends when eating larger meals or less active  He also thinks he takes 1 or 2 clicks when blood sugars are high late at night, and not clear if this is sometimes burning overnight hypoglycemia  He is reluctant to use the OmniPod pump because of higher out-of-pocket expense compared to  the V-go pump  Also his insurance does not cover the version 2 freestyle libre as well  He changes his V-go pump every evening  He feels that he is recognizing hypoglycemia even when this occurs during the night  Side effects from diabetes medications have been: None  Glucose monitoring:       Glucometer:  Freestyle Libre     Sensor description: Libre version #1  Blood sugar patterns:  Summary: Variability is higher with GV 40  Although blood sugars are on an average within the target range all the time is most significant rise in blood sugar is early morning between about 4-7 AM which is relatively consistent  Generally hyperglycemia was present frequently after meals about 10 days ago and appears to be more on weekends  HYPOGLYCEMIA occurs overnight periodically and on 4-5 nights in the last 2 weeks, sometimes preceded by hyperglycemia  More recently postprandial hyperglycemia has been less prominent but may still occur periodically after dinner  Blood sugars after breakfast are not rising but generally has a rise in blood sugars between waking up time at breakfast time  On an average highest blood sugars are between 6-8 AM and LOWEST between 2-4 AM  Results statistics:  CGM use % of time  99  2-week average/GV  147/40  Time in range  64% was 58  % Time Above 180  23  % Time above 250 6  % Time Below 70  7     PRE-MEAL Fasting Lunch Dinner Bedtime Overall  Glucose range:       Averages:        POST-MEAL PC Breakfast PC Lunch PC Dinner  Glucose range:     Averages:         CGM use % of time  99  2-week average/SD   Time in range  58     % was 70  % Time Above 180  24  % Time above 250  12  % Time Below 70 6     PRE-MEAL Fasting Lunch Dinner Bedtime Overall  Glucose range:       Averages:  130  137  156  162  162   POST-MEAL PC Breakfast PC Lunch PC Dinner  Glucose range:     Averages:  190  205  182     Self-care: The diet that the patient has  been following is: tries to limit High-fat foods .     Typical meal intake: Breakfast is cereal, sometimes oatmeal or biscuits, lunch is a sandwich and fruit. Lunch 1 pm Dinner is baked chicken, salad and rice at 7 pm .   Snacks are on Peanut butter and crackers, having this at 10. 30 a.m. at work                Dietician visit, most recent: At diagnosis only CDE visit: 7/17               Exercise: On his feet at work.    Weight history: Maximum 285  Wt Readings from Last 3 Encounters:  01/06/21 221 lb 3.2 oz (100.3 kg)  10/04/20 221 lb 12.8 oz (100.6 kg)  05/14/20 (!) 222 lb 3.2 oz (100.8 kg)    Glycemic control:   Lab Results  Component Value Date   HGBA1C 7.0 (A) 01/06/2021   HGBA1C 7.4 (A) 10/04/2020   HGBA1C 7.7 (A) 05/14/2020   Lab Results  Component Value Date   MICROALBUR 0.8 07/22/2019   LDLCALC 55 05/14/2020   CREATININE 1.51 (H) 10/04/2020   Lab Results  Component Value Date   MICRALBCREAT 6 05/14/2020       Allergies as of 01/06/2021      Reactions   Penicillins Rash      Medication List       Accurate as of January 06, 2021  4:19 PM. If you have any questions, ask your nurse or doctor.        amLODipine 5 MG tablet Commonly known as: NORVASC TAKE 1 TABLET (5 MG TOTAL) BY MOUTH DAILY.   FreeStyle Libre 14 Day Sensor Misc USE ONE SENSOR ONCE EVERY 14 DAYS TO MONITOR BLOOD SUGAR.   glucose blood test strip Commonly known as: Contour Next Test 1 each by Other route as needed for other. Check blood sugar before all meals and at HS. Check 2 hours after a meal 5 times per week.   glucose blood test strip Commonly known as: FreeStyle Precision Neo Test Use as instructed to check blood sugar 3 times daily.   Contour Next Test test strip Generic drug: glucose blood CHECK BLOOD SUGAR BEFORE ALL MEALS AND AT BEDTIME. CHECK 2 HOURS AFTER A MEAL 5 TIMES PER WEEK.   insulin aspart 100 UNIT/ML injection Commonly known as: NovoLOG USE MAX 65 UNITS  WITH V-GO  INSULIN PUMP DAILY   losartan 50 MG tablet Commonly known as: COZAAR TAKE 1 TABLET BY MOUTH EVERY DAY   metFORMIN 1000 MG tablet Commonly known as: GLUCOPHAGE TAKE 1 TABLET BY MOUTH IN THE MORNING AND 1/2 (ONE-HALF) IN THE EVENING   omeprazole 40 MG capsule Commonly known as: PRILOSEC Take 40 mg by mouth daily.   OneTouch Delica Lancets 68T Misc Use to check blood sugar 3 times per day.   rosuvastatin 5 MG tablet Commonly known as: CRESTOR Take 1 tablet (5 mg total) by mouth daily.   V-Go 20 Kit USE AS DIRECTED ONCE DAILY       Allergies:  Allergies  Allergen Reactions  . Penicillins Rash    Past Medical History:  Diagnosis Date  . Diabetes mellitus without complication ([place])   . GERD (gastroesophageal reflux disease)   . High cholesterol   . History of kidney stones     Past Surgical History:  Procedure Laterality Date  . CARPAL TUNNEL RELEASE Bilateral    10 +years ago  . ESOPHAGOGASTRODUODENOSCOPY  2000   stretching of esophagus  . EXTRACORPOREAL SHOCK WAVE LITHOTRIPSY Right 04/08/2018   Procedure: RIGHT EXTRACORPOREAL SHOCK WAVE LITHOTRIPSY (ESWL);  Surgeon: Cleon Gustin, MD;  Location: [facility];  Service: Urology;  Laterality: Right;    Family History  Problem Relation Age of Onset  . Heart disease Mother   . Diabetes Mother   . Diabetes Sister   . Diabetes Maternal Grandmother     Social History:  reports that he has never smoked. He has never used smokeless tobacco. He reports that he does not drink alcohol and does not use drugs.   Review of Systems     Lipid history: LDL usually controlled with Crestor 5 mg    Lab Results  Component Value Date   CHOL 127 05/14/2020   HDL 59 05/14/2020   LDLCALC 55 05/14/2020   TRIG 64 05/14/2020   CHOLHDL 2.2 05/14/2020           Hypertension: Is now taking amlodipine for significantly high blood pressure Losartan was not started because of abnormal creatinine He did not take Advil  or Aleve Not checking blood pressure at home  BP Readings from Last 3 Encounters:  01/06/21 140/78  10/04/20 (!) 162/100  05/14/20 (!) 140/82    Has renal function abnormality without diabetic nephropathy with variable levels Also has history of kidney stone which he thinks he may have passed in 12/21  Lab Results  Component Value Date   CREATININE 1.51 (H) 10/04/2020   CREATININE 1.31 (H) 05/14/2020   CREATININE 1.44 07/22/2019     Most recent eye exam was 6/18 and he has not scheduled follow-up  Most recent foot exam: 9/20  He has not had his Covid vaccine and is not willing to do this   LABS:  Office Visit on 01/06/2021  Component Date Value Ref Range Status  . Hemoglobin A1C 01/06/2021 7.0* 4.0 - 5.6 % Final    Physical Examination:  BP 140/78 (BP Location: Left Arm, Patient Position: Sitting, Cuff Size: Normal)   Pulse (!) 58   Resp 20   Ht '6\' 1"'  (1.854 m)   Wt 221 lb 3.2 oz (100.3 kg)   SpO2 97%   BMI 29.18 kg/m   Heart rhythm normal, heart sounds normal   ASSESSMENT:  Diabetes type 2, uncontrolled    See history of present illness for detailed discussion of current diabetes management, blood sugar patterns and  problems identified  He has had long-standing diabetes, on basal bolus insulin and metformin  A1c is 7  His blood sugar control is still suboptimal with only 64% of blood sugars within target range and 7% below 70 As before he is reluctant to change his V-go pump which is inadequate in providing fine-tuning of his insulin doses and need for a closed-loop system Also not clear if sometimes freestyle libre may be falsely low Has tendency to low sugars overnight or before lunch but not consistently He also has a dawn phenomenon for which he is not taking adequate boluses Day-to-day management and abnormal patterns were discussed in detail  LIPIDS: Needs follow-up labs, currently on Crestor  Hypertension: Blood pressure is improved with  starting amlodipine    PLAN:   He will reduce Metformin to half tablet in the morning Make sure he boluses 2-3 clicks in the morning on waking up with his coffee to avoid the dawn phenomenon Check to see if Dexcom is covered Avoid bolusing excessively for high sugars late at night He will periodically check fingersticks to compare with freestyle libre Again consider using the OmniPod pump if affordable  No change in amlodipine  Check renal function and if consistently abnormal consider nephrology consultation  Also needs to establish with a new PCP who he can see regularly  There are no Patient Instructions on file for this visit.    Elayne Snare 01/06/2021, 4:19 PM   Note: This office note was prepared with Dragon voice recognition system technology. Any transcriptional errors that result from this process are unintentional.

## 2021-01-06 ENCOUNTER — Ambulatory Visit: Payer: BC Managed Care – PPO | Admitting: Endocrinology

## 2021-01-06 ENCOUNTER — Encounter: Payer: Self-pay | Admitting: Endocrinology

## 2021-01-06 ENCOUNTER — Other Ambulatory Visit: Payer: Self-pay

## 2021-01-06 VITALS — BP 140/78 | HR 58 | Resp 20 | Ht 73.0 in | Wt 221.2 lb

## 2021-01-06 DIAGNOSIS — Z794 Long term (current) use of insulin: Secondary | ICD-10-CM

## 2021-01-06 DIAGNOSIS — E7800 Pure hypercholesterolemia, unspecified: Secondary | ICD-10-CM | POA: Diagnosis not present

## 2021-01-06 DIAGNOSIS — E1165 Type 2 diabetes mellitus with hyperglycemia: Secondary | ICD-10-CM | POA: Diagnosis not present

## 2021-01-06 DIAGNOSIS — N289 Disorder of kidney and ureter, unspecified: Secondary | ICD-10-CM

## 2021-01-06 DIAGNOSIS — I10 Essential (primary) hypertension: Secondary | ICD-10-CM | POA: Diagnosis not present

## 2021-01-06 LAB — POCT GLYCOSYLATED HEMOGLOBIN (HGB A1C): Hemoglobin A1C: 7 % — AB (ref 4.0–5.6)

## 2021-01-06 NOTE — Patient Instructions (Addendum)
Take 1/2 Metformin twice daily  1-2 clicks for am coffee

## 2021-01-07 LAB — LIPID PANEL
Cholesterol: 133 mg/dL (ref 0–200)
HDL: 57.2 mg/dL (ref >39.00)
LDL Cholesterol: 61 mg/dL (ref 0–99)
NonHDL: 75.41
Total CHOL/HDL Ratio: 2
Triglycerides: 71 mg/dL (ref 0.0–149.0)
VLDL: 14.2 mg/dL (ref 0.0–40.0)

## 2021-01-07 LAB — MICROALBUMIN / CREATININE URINE RATIO
Creatinine,U: 224.1 mg/dL
Microalb Creat Ratio: 0.8 mg/g (ref 0.0–30.0)
Microalb, Ur: 1.8 mg/dL (ref 0.0–1.9)

## 2021-01-07 LAB — URINALYSIS, ROUTINE W REFLEX MICROSCOPIC
Bilirubin Urine: NEGATIVE
Hgb urine dipstick: NEGATIVE
Ketones, ur: NEGATIVE
Leukocytes,Ua: NEGATIVE
Nitrite: NEGATIVE
RBC / HPF: NONE SEEN (ref 0–?)
Specific Gravity, Urine: >=1.03 — AB (ref 1.000–1.030)
Urine Glucose: NEGATIVE
Urobilinogen, UA: 0.2 (ref 0.0–1.0)
pH: 6 (ref 5.0–8.0)

## 2021-01-07 LAB — COMPREHENSIVE METABOLIC PANEL
ALT: 18 U/L (ref 0–53)
AST: 26 U/L (ref 0–37)
Albumin: 4.1 g/dL (ref 3.5–5.2)
Alkaline Phosphatase: 53 U/L (ref 39–117)
BUN: 18 mg/dL (ref 6–23)
CO2: 28 mEq/L (ref 19–32)
Calcium: 9.3 mg/dL (ref 8.4–10.5)
Chloride: 108 mEq/L (ref 96–112)
Creatinine, Ser: 1.28 mg/dL (ref 0.40–1.50)
GFR: 62.09 mL/min (ref >60.00)
Glucose, Bld: 40 mg/dL — CL (ref 70–99)
Potassium: 3.6 mEq/L (ref 3.5–5.1)
Sodium: 144 mEq/L (ref 135–145)
Total Bilirubin: 0.4 mg/dL (ref 0.2–1.2)
Total Protein: 6.9 g/dL (ref 6.0–8.3)

## 2021-01-10 ENCOUNTER — Other Ambulatory Visit: Payer: Self-pay | Admitting: Endocrinology

## 2021-01-10 MED ORDER — DEXCOM G6 TRANSMITTER MISC: 1.0000 | Freq: Once | 1 refills | Status: DC

## 2021-01-10 MED ORDER — DEXCOM G6 SENSOR MISC: 3 refills | Status: DC

## 2021-01-23 ENCOUNTER — Other Ambulatory Visit: Payer: Self-pay | Admitting: Endocrinology

## 2021-02-03 ENCOUNTER — Telehealth: Payer: Self-pay | Admitting: Endocrinology

## 2021-02-03 ENCOUNTER — Other Ambulatory Visit: Payer: Self-pay | Admitting: *Deleted

## 2021-02-03 DIAGNOSIS — E1165 Type 2 diabetes mellitus with hyperglycemia: Secondary | ICD-10-CM

## 2021-02-03 MED ORDER — DEXCOM G6 RECEIVER DEVI: 0 refills | Status: DC

## 2021-02-03 NOTE — Telephone Encounter (Signed)
[facility] Dexcom G6 reciever [facility]

## 2021-02-03 NOTE — Telephone Encounter (Signed)
MEDICATION: DexCom G6 Reader  PHARMACY:  [facility]  HAS THE PATIENT CONTACTED THEIR PHARMACY?  no  IS THIS A 90 DAY SUPPLY :   IS PATIENT OUT OF MEDICATION:  New RX  IF NOT; HOW MUCH IS LEFT:   LAST APPOINTMENT DATE: @4 /12/2020  NEXT APPOINTMENT DATE:@6 /16/2022  DO WE HAVE YOUR PERMISSION TO LEAVE A DETAILED MESSAGE?: yes 02-09-1991  OTHER COMMENTS:    **Let patient know to contact pharmacy at the end of the day to make sure medication is ready. **  ** Please notify patient to allow 48-72 hours to process**  **Encourage patient to contact the pharmacy for refills or they can request refills through [facility]**

## 2021-03-24 ENCOUNTER — Other Ambulatory Visit: Payer: Self-pay | Admitting: Internal Medicine

## 2021-04-07 ENCOUNTER — Ambulatory Visit: Payer: BC Managed Care – PPO | Admitting: Endocrinology

## 2021-04-07 ENCOUNTER — Other Ambulatory Visit: Payer: Self-pay

## 2021-04-07 VITALS — BP 144/82 | HR 71 | Ht 72.0 in | Wt 222.2 lb

## 2021-04-07 DIAGNOSIS — Z794 Long term (current) use of insulin: Secondary | ICD-10-CM

## 2021-04-07 DIAGNOSIS — E1165 Type 2 diabetes mellitus with hyperglycemia: Secondary | ICD-10-CM | POA: Diagnosis not present

## 2021-04-07 DIAGNOSIS — I10 Essential (primary) hypertension: Secondary | ICD-10-CM | POA: Diagnosis not present

## 2021-04-07 DIAGNOSIS — E7800 Pure hypercholesterolemia, unspecified: Secondary | ICD-10-CM | POA: Diagnosis not present

## 2021-04-07 LAB — POCT GLYCOSYLATED HEMOGLOBIN (HGB A1C): Hemoglobin A1C: 7 % — AB (ref 4.0–5.6)

## 2021-04-07 MED ORDER — ROSUVASTATIN CALCIUM 5 MG PO TABS: 5.0000 mg | ORAL_TABLET | Freq: Every day | ORAL | 1 refills | Status: DC

## 2021-04-07 NOTE — Progress Notes (Signed)
Patient ID: Christian Yang, male   DOB: 06-19-1961, 60 y.o.   MRN: [id number]           Reason for Appointment: Follow-up for Type 2 Diabetes   History of Present Illness:          Date of diagnosis of type 2 diabetes mellitus:   2008       Background history:   He apparently had a weight of 285 pounds at diagnosis He was having dry mouth and flulike symptoms at onset and glucose was 672 He was started on metformin  He had been on Jardiance for 2 years and Bydureon since 6/17 Records indicated that his A1c has been 10-11+ since at least 2015 Levemir insulin was started 3-4 years prior to his consultation but without adequate control   He was started on the V-go pump in 04/2015  Recent history:   INSULIN regimen is: Insulin pump, V-go 20 basal, mealtime clicks 3-4 at breakfast and 3-4 clicks at  lunch and 3-4 at dinner  Non-insulin hypoglycemic drugs the patient is taking are: metformin 1 g a.m.--0.5 p.m.  Current management, blood sugar patterns and problems identified: A1c is now 7 and unchanged   He has now started using the Dexcom instead of the freestyle, was using the original freestyle version previously Although this is a little more expensive and he prefers this for about continuous display and better accuracy and alerts  He was told to stop the evening Metformin a couple of times to avoid low sugars overnight but he still takes 500 mg twice daily However he has had less no overnight hypoglycemia However he has slightly low alarm at 80 instead of 70 and is frequently taking snacks when blood sugars are around 80 Blood sugar patterns are discussed below in the Dexcom analysis He continues to have high sugars after meals especially after dinner, appears that this may be more related to higher fat meals like lasagna. He will occasionally take extra boluses at bedtime when the blood sugars are consistently high but he still has an average blood sugar around 200 before  bedtime Also he is still having some spikes in his blood sugars after breakfast, may not always be getting much protein except bacon and then will have low normal readings or low sugars around 8-9 AM when he gets to start work   Side effects from diabetes medications have been: None    Sensor description: Dexcom G6  Blood sugar patterns: Summary: His blood sugars are showing significant variability at all times with tendency to high readings after breakfast and late evening and better readings overnight as well as in the afternoons His blood sugars are above target of 180 starting about 8 PM until midnight Otherwise overnight blood sugars usually to stand to good readings around 5-6 AM and then start rising POSTPRANDIAL blood sugar spikes are present after breakfast and after supper but not after lunch highest blood sugars are between 10 PM-midnight even though his evening meal is usually around 6 PM HYPOGLYCEMIA occurs inconsistently around 8-9 AM and may be low normal around 6 PM and usually transiently  Results statistics:  CGM use % of time 99  2-week average/SD 161/63  Time in range 67     %  % Time Above 180 31  % Time above 250 10  % Time Below 70 1.6     Previous data:  CGM use % of time  99  2-week average/GV  147/40  Time  in range      64% was 58  % Time Above 180  23  % Time above 250 6  % Time Below 70  7      PRE-MEAL Fasting Lunch Dinner Bedtime Overall  Glucose range:       Averages:  130  137  156  162  162   POST-MEAL PC Breakfast PC Lunch PC Dinner  Glucose range:     Averages:  190  205  182     Self-care: The diet that the patient has been following is: tries to limit High-fat foods .     Typical meal intake: Breakfast is cereal, sometimes oatmeal or biscuits, lunch is a sandwich and fruit. Lunch 1 pm Dinner is baked chicken, salad and rice at 7 pm .   Snacks are on Peanut butter and crackers, having this at 10. 30 a.m. at work                 Dietician visit, most recent: At diagnosis only CDE visit: 7/17               Exercise: On his feet at work.    Weight history: Maximum 285  Wt Readings from Last 3 Encounters:  04/07/21 222 lb 3.2 oz (100.8 kg)  01/06/21 221 lb 3.2 oz (100.3 kg)  10/04/20 221 lb 12.8 oz (100.6 kg)    Glycemic control:   Lab Results  Component Value Date   HGBA1C 7.0 (A) 04/07/2021   HGBA1C 7.0 (A) 01/06/2021   HGBA1C 7.4 (A) 10/04/2020   Lab Results  Component Value Date   MICROALBUR 1.8 01/06/2021   LDLCALC 61 01/06/2021   CREATININE 1.28 01/06/2021   Lab Results  Component Value Date   MICRALBCREAT 0.8 01/06/2021       Allergies as of 04/07/2021       Reactions   Penicillins Rash        Medication List        Accurate as of April 07, 2021 11:59 PM. If you have any questions, ask your nurse or doctor.          STOP taking these medications    losartan 50 MG tablet Commonly known as: COZAAR Stopped by: Elayne Snare, MD   metFORMIN 1000 MG tablet Commonly known as: GLUCOPHAGE Stopped by: Elayne Snare, MD       TAKE these medications    amLODipine 5 MG tablet Commonly known as: NORVASC TAKE 1 TABLET (5 MG TOTAL) BY MOUTH DAILY.   Dexcom G6 Receiver Devi Use as directed Check sugar daily. E11.65   Dexcom G6 Sensor Misc Use to monitor blood sugar, change after 10 days What changed: Another medication with the same name was removed. Continue taking this medication, and follow the directions you see here. Changed by: Elayne Snare, MD   glucose blood test strip Commonly known as: Contour Next Test 1 each by Other route as needed for other. Check blood sugar before all meals and at HS. Check 2 hours after a meal 5 times per week. What changed: Another medication with the same name was removed. Continue taking this medication, and follow the directions you see here. Changed by: Elayne Snare, MD   Contour Next Test test strip Generic drug: glucose blood CHECK  BLOOD SUGAR BEFORE ALL MEALS AND AT BEDTIME. CHECK 2 HOURS AFTER A MEAL 5 TIMES PER WEEK. What changed: Another medication with the same name was removed. Continue taking this medication, and  follow the directions you see here. Changed by: Elayne Snare, MD   NovoLOG 100 UNIT/ML injection Generic drug: insulin aspart USE MAX 65 UNITS WITH V-GO INSULIN PUMP DAILY   omeprazole 40 MG capsule Commonly known as: PRILOSEC Take 40 mg by mouth daily.   OneTouch Delica Lancets 71Q Misc Use to check blood sugar 3 times per day.   rosuvastatin 5 MG tablet Commonly known as: CRESTOR Take 1 tablet (5 mg total) by mouth daily.   V-Go 20 Kit USE AS DIRECTED ONCE DAILY        Allergies:  Allergies  Allergen Reactions   Penicillins Rash    Past Medical History:  Diagnosis Date   Diabetes mellitus without complication (HCC)    GERD (gastroesophageal reflux disease)    High cholesterol    History of kidney stones     Past Surgical History:  Procedure Laterality Date   CARPAL TUNNEL RELEASE Bilateral    10 +years ago   ESOPHAGOGASTRODUODENOSCOPY  2000   stretching of esophagus   EXTRACORPOREAL SHOCK WAVE LITHOTRIPSY Right 04/08/2018   Procedure: RIGHT EXTRACORPOREAL SHOCK WAVE LITHOTRIPSY (ESWL);  Surgeon: Cleon Gustin, MD;  Location: [facility];  Service: Urology;  Laterality: Right;    Family History  Problem Relation Age of Onset   Heart disease Mother    Diabetes Mother    Diabetes Sister    Diabetes Maternal Grandmother     Social History:  reports that he has never smoked. He has never used smokeless tobacco. He reports that he does not drink alcohol and does not use drugs.   Review of Systems     Lipid history: LDL usually controlled with Crestor 5 mg    Lab Results  Component Value Date   CHOL 133 01/06/2021   HDL 57.20 01/06/2021   LDLCALC 61 01/06/2021   TRIG 71.0 01/06/2021   CHOLHDL 2 01/06/2021           Hypertension: Is taking amlodipine for  significantly high blood pressure He thinks blood pressure is higher today because of rushing in  Losartan was not started because of abnormal creatinine  He did not take Advil or Aleve Not checking blood pressure at home  BP Readings from Last 3 Encounters:  04/07/21 (!) 144/82  01/06/21 140/78  10/04/20 (!) 162/100    Has renal function abnormality without diabetic nephropathy with variable levels Also has history of kidney stone which he thinks he may have passed in 12/21  Lab Results  Component Value Date   CREATININE 1.28 01/06/2021   CREATININE 1.51 (H) 10/04/2020   CREATININE 1.31 (H) 05/14/2020   Lab Results  Component Value Date   K 3.6 01/06/2021     Most recent eye exam was 6/18 and he has not scheduled follow-up  Most recent foot exam: 9/20  He has not had his Covid vaccine and is not willing to do this   LABS:  Office Visit on 04/07/2021  Component Date Value Ref Range Status   Hemoglobin A1C 04/07/2021 7.0 (A) 4.0 - 5.6 % Final    Physical Examination:  BP (!) 144/82   Pulse 71   Ht 6' (1.829 m)   Wt 222 lb 3.2 oz (100.8 kg)   SpO2 92%   BMI 30.14 kg/m      ASSESSMENT:  Diabetes type 2, uncontrolled    See history of present illness for detailed discussion of current diabetes management, blood sugar patterns and problems identified  He has had long-standing  diabetes, on basal bolus insulin and metformin  A1c is 7, unchanged  His blood sugar control is slightly better with less hypoglycemia and better target achievement However his blood sugars are significantly higher late evening after his meals especially high fat intake Discussed need for adequate boluses and also better managed boluses for higher fat meals in the evening Morning blood sugars are better with taking early morning boluses Likely will benefit from a fast acting insulin at least for breakfast when his blood sugars go up more significantly  Day-to-day management and  abnormal blood sugar patterns, control of postprandial hyperglycemia were discussed in detail  LIPIDS: Needs to stay on his Crestor, previously well controlled  Hypertension: Blood pressure is relatively higher but may be whitecoat syndrome as it was better on second measurement Currently only on 5 mg amlodipine    PLAN:   He will reduce stop metformin completely Trial of FIASP insulin instead of NovoLog, sample given Reminding him to bolus ahead of time in the morning before breakfast Discussed needing to add more protein to breakfast rather than just eating bacon and given handout examples of protein in the morning Also especially with higher fat meals or pasta he will increase his bolus clinic as well as need to take additional bolus clicks 1 to 2 hours after his meal if blood sugars are staying consistently higher Again discussed potentially using a closed-loop insulin pump system which would be beneficial but he prefers to stay on the V-go  Periodically check blood pressure at home    Patient Instructions  Stop Metformin    Elayne Snare 04/08/2021, 10:21 AM   Note: This office note was prepared with Dragon voice recognition system technology. Any transcriptional errors that result from this process are unintentional.

## 2021-04-07 NOTE — Patient Instructions (Signed)
Stop Metformin

## 2021-04-26 ENCOUNTER — Telehealth: Payer: Self-pay | Admitting: Endocrinology

## 2021-04-26 MED ORDER — FIASP 100 UNIT/ML IJ SOLN: INTRAMUSCULAR | 3 refills | Status: DC

## 2021-04-26 NOTE — Telephone Encounter (Signed)
Patient tried a sample of Fiasp and called asking for a refill of that.  PHARMACY -   [id number] - [place], [place] - [address]  [address], [place] [address]  Phone:  [phone]  Fax:  [phone]

## 2021-05-28 ENCOUNTER — Other Ambulatory Visit: Payer: Self-pay | Admitting: Endocrinology

## 2021-06-29 ENCOUNTER — Other Ambulatory Visit: Payer: Self-pay | Admitting: Endocrinology

## 2021-07-11 ENCOUNTER — Ambulatory Visit: Payer: BC Managed Care – PPO | Admitting: Endocrinology

## 2021-07-11 ENCOUNTER — Encounter: Payer: Self-pay | Admitting: Endocrinology

## 2021-07-11 ENCOUNTER — Other Ambulatory Visit: Payer: Self-pay

## 2021-07-11 VITALS — BP 170/90 | HR 62 | Ht 73.0 in | Wt 227.4 lb

## 2021-07-11 DIAGNOSIS — Z794 Long term (current) use of insulin: Secondary | ICD-10-CM | POA: Diagnosis not present

## 2021-07-11 DIAGNOSIS — E7800 Pure hypercholesterolemia, unspecified: Secondary | ICD-10-CM

## 2021-07-11 DIAGNOSIS — E1165 Type 2 diabetes mellitus with hyperglycemia: Secondary | ICD-10-CM

## 2021-07-11 DIAGNOSIS — I10 Essential (primary) hypertension: Secondary | ICD-10-CM

## 2021-07-11 LAB — COMPREHENSIVE METABOLIC PANEL
ALT: 16 U/L (ref 0–53)
AST: 16 U/L (ref 0–37)
Albumin: 4.5 g/dL (ref 3.5–5.2)
Alkaline Phosphatase: 56 U/L (ref 39–117)
BUN: 23 mg/dL (ref 6–23)
CO2: 28 mEq/L (ref 19–32)
Calcium: 9.9 mg/dL (ref 8.4–10.5)
Chloride: 105 mEq/L (ref 96–112)
Creatinine, Ser: 1.39 mg/dL (ref 0.40–1.50)
GFR: 56.04 mL/min — ABNORMAL LOW (ref >60.00)
Glucose, Bld: 80 mg/dL (ref 70–99)
Potassium: 4 mEq/L (ref 3.5–5.1)
Sodium: 139 mEq/L (ref 135–145)
Total Bilirubin: 0.4 mg/dL (ref 0.2–1.2)
Total Protein: 7.7 g/dL (ref 6.0–8.3)

## 2021-07-11 LAB — LIPID PANEL
Cholesterol: 136 mg/dL (ref 0–200)
HDL: 68.7 mg/dL (ref >39.00)
LDL Cholesterol: 54 mg/dL (ref 0–99)
NonHDL: 67.23
Total CHOL/HDL Ratio: 2
Triglycerides: 65 mg/dL (ref 0.0–149.0)
VLDL: 13 mg/dL (ref 0.0–40.0)

## 2021-07-11 LAB — POCT GLYCOSYLATED HEMOGLOBIN (HGB A1C): Hemoglobin A1C: 6.7 % — AB (ref 4.0–5.6)

## 2021-07-11 MED ORDER — AMLODIPINE BESYLATE 10 MG PO TABS: 10.0000 mg | ORAL_TABLET | Freq: Every day | ORAL | 1 refills | Status: DC

## 2021-07-11 MED ORDER — HYDROCHLOROTHIAZIDE 12.5 MG PO CAPS: 12.5000 mg | ORAL_CAPSULE | Freq: Every day | ORAL | 1 refills | Status: DC

## 2021-07-11 MED ORDER — CONTOUR NEXT TEST VI STRP
ORAL_STRIP | 3 refills | Status: AC
Start: 2021-07-11 — End: ?

## 2021-07-11 NOTE — Progress Notes (Signed)
Patient ID: Christian Yang, male   DOB: August 04, 1961, 60 y.o.   MRN: [id number]           Reason for Appointment: Follow-up for Type 2 Diabetes   History of Present Illness:          Date of diagnosis of type 2 diabetes mellitus:   2008       Background history:   He apparently had a weight of 285 pounds at diagnosis He was having dry mouth and flulike symptoms at onset and glucose was 672 He was started on metformin  He had been on Jardiance for 2 years and Bydureon since 6/17 Records indicated that his A1c has been 10-11+ since at least 2015 Levemir insulin was started 3-4 years prior to his consultation but without adequate control   He was started on the V-go pump in 04/2015  Recent history:   INSULIN regimen is: Insulin pump, V-go 20 basal, mealtime clicks 3-4 at breakfast and 3-4 clicks at  lunch and 2-4 at dinner  Non-insulin hypoglycemic drugs the patient is taking are: metformin 1 g a.m.--0.5 p.m.  Current management, blood sugar patterns and problems identified: A1c is now lower at 6.7  He has continued using the Dexcom  More recently blood sugars have been generally better managed and showing less variability as well as better time in range  As discussed in the CGM interpretation he still has occasional postprandial spikes even though he has tried to have some protein consistently at all meals, eating more eggs at breakfast His main difficulty again his tendency to low sugars while at work which is mostly midmorning or late afternoon  He tries to prevent this and low normal sugars with glucose tablets, peanut butter crackers but still has occasional mild hypoglycemia, usually trying to take action when the Dexcom is alerting him FIASP insulin was started on the last visit and he probably has less postprandial hyperglycemia with this; however still sometimes has sharp rise in blood sugars after some meals  Hyperglycemia is likely better with using Fiasp  He was told to  stop the evening Metformin but he thinks that occasionally he has taken this if his blood sugars are getting higher overnight with some improvement   Side effects from diabetes medications have been: None    Sensor description: Dexcom G6  Interpretation of the CGM download as follows  His blood sugars on average are within the target range throughout the day and night On an average of blood sugars are mildly increased around 150 average most of the time and generally much lower mid morning until about 12 noon Although variability is less than on his last visit he still has some variability overnight, after lunch and after breakfast POSTPRANDIAL readings tend to be fairly level after most of his meals on an average but usually are tending to be low normal or low between 8-9 AM However postprandial readings can sometimes rise very quickly either after lunch or dinner taking over 200 Has occasionally low normal readings after dinner bolus Hypoglycemia tends to occur midmorning between 8:30 AM and 11:30 AM periodically and also occasionally between 5-6:30 PM, usually transient Overnight blood sugars are somewhat variable but tend to be mostly high in the last week  CGM use % of time 99  2-week average/GV 140/32  Time in range      79, was 67%  % Time Above 180 18  % Time above 250 2  % Time Below 70 3.2  Previous statistics:  CGM use % of time 99  2-week average/SD 161/63  Time in range 67     %  % Time Above 180 31  % Time above 250 10  % Time Below 70 1.6         Self-care: The diet that the patient has been following is: tries to limit High-fat foods .     Typical meal intake: Breakfast is cereal, sometimes oatmeal or biscuits, lunch is a sandwich and fruit. Lunch 1 pm Dinner is baked chicken, salad and rice at 7 pm .   Snacks are on Peanut butter and crackers, having this at 10. 30 a.m. at work                Dietician visit, most recent: At diagnosis only CDE visit:  7/17               Exercise: On his feet at work.    Weight history: Maximum 285  Wt Readings from Last 3 Encounters:  07/11/21 227 lb 6.4 oz (103.1 kg)  04/07/21 222 lb 3.2 oz (100.8 kg)  01/06/21 221 lb 3.2 oz (100.3 kg)    Glycemic control:   Lab Results  Component Value Date   HGBA1C 6.7 (A) 07/11/2021   HGBA1C 7.0 (A) 04/07/2021   HGBA1C 7.0 (A) 01/06/2021   Lab Results  Component Value Date   MICROALBUR 1.8 01/06/2021   LDLCALC 54 07/11/2021   CREATININE 1.39 07/11/2021   Lab Results  Component Value Date   MICRALBCREAT 0.8 01/06/2021       Allergies as of 07/11/2021       Reactions   Penicillins Rash        Medication List        Accurate as of July 11, 2021  8:53 PM. If you have any questions, ask your nurse or doctor.          amLODipine 5 MG tablet Commonly known as: NORVASC TAKE 1 TABLET (5 MG TOTAL) BY MOUTH DAILY.   Dexcom G6 Receiver Devi Use as directed Check sugar daily. E11.65   Dexcom G6 Sensor Misc USE TO MONITOR BLOOD SUGAR, CHANGE AFTER 10 DAYS   Fiasp 100 UNIT/ML Soln Generic drug: Insulin Aspart (w/Niacinamide) USE MAX 65 UNITS WITH V-GO INSULIN PUMP DAILY   glucose blood test strip Commonly known as: Contour Next Test 1 each by Other route as needed for other. Check blood sugar before all meals and at HS. Check 2 hours after a meal 5 times per week. What changed: Another medication with the same name was changed. Make sure you understand how and when to take each. Changed by: Elayne Snare, MD   Contour Next Test test strip Generic drug: glucose blood CHECK BLOOD SUGAR 2x daily What changed: See the new instructions. Changed by: Elayne Snare, MD   omeprazole 40 MG capsule Commonly known as: PRILOSEC Take 40 mg by mouth daily.   OneTouch Delica Lancets 10C Misc Use to check blood sugar 3 times per day.   rosuvastatin 5 MG tablet Commonly known as: CRESTOR Take 1 tablet (5 mg total) by mouth daily.   V-Go 20  Kit USE AS DIRECTED ONCE DAILY        Allergies:  Allergies  Allergen Reactions   Penicillins Rash    Past Medical History:  Diagnosis Date   Diabetes mellitus without complication (HCC)    GERD (gastroesophageal reflux disease)    High cholesterol    History  of kidney stones     Past Surgical History:  Procedure Laterality Date   CARPAL TUNNEL RELEASE Bilateral    10 +years ago   ESOPHAGOGASTRODUODENOSCOPY  2000   stretching of esophagus   EXTRACORPOREAL SHOCK WAVE LITHOTRIPSY Right 04/08/2018   Procedure: RIGHT EXTRACORPOREAL SHOCK WAVE LITHOTRIPSY (ESWL);  Surgeon: Cleon Gustin, MD;  Location: [facility];  Service: Urology;  Laterality: Right;    Family History  Problem Relation Age of Onset   Heart disease Mother    Diabetes Mother    Diabetes Sister    Diabetes Maternal Grandmother     Social History:  reports that he has never smoked. He has never used smokeless tobacco. He reports that he does not drink alcohol and does not use drugs.   Review of Systems    Lipid history: LDL usually controlled with Crestor 5 mg    Lab Results  Component Value Date   CHOL 136 07/11/2021   HDL 68.70 07/11/2021   LDLCALC 54 07/11/2021   TRIG 65.0 07/11/2021   CHOLHDL 2 07/11/2021           Hypertension: Is taking amlodipine 5 mg only currently Losartan was not started because of abnormal creatinine at baseline  Only occasionally checking blood pressure at home, not checked for couple of weeks at least and previously 130s/80 with wrist meter Blood pressure was high check twice today  BP Readings from Last 3 Encounters:  07/11/21 (!) 170/90  04/07/21 (!) 144/82  01/06/21 140/78    Has renal function abnormality without diabetic nephropathy with variable levels Also has history of kidney stone which he thinks he may have passed in 12/21  Lab Results  Component Value Date   CREATININE 1.39 07/11/2021   CREATININE 1.28 01/06/2021   CREATININE 1.51 (H)  10/04/2020   Lab Results  Component Value Date   K 4.0 07/11/2021     Most recent eye exam was 6/18 and he has not scheduled follow-up  Most recent foot exam: 9/22  He has not had his Covid vaccine and is not willing to do this   LABS:  Office Visit on 07/11/2021  Component Date Value Ref Range Status   Hemoglobin A1C 07/11/2021 6.7 (A) 4.0 - 5.6 % Final   Cholesterol 07/11/2021 136  0 - 200 mg/dL Final   ATP III Classification       Desirable:  < 200 mg/dL               Borderline High:  200 - 239 mg/dL          High:  > = 240 mg/dL   Triglycerides 07/11/2021 65.0  0.0 - 149.0 mg/dL Final   Normal:  <150 mg/dLBorderline High:  150 - 199 mg/dL   HDL 07/11/2021 68.70  >39.00 mg/dL Final   VLDL 07/11/2021 13.0  0.0 - 40.0 mg/dL Final   LDL Cholesterol 07/11/2021 54  0 - 99 mg/dL Final   Total CHOL/HDL Ratio 07/11/2021 2   Final                  Men          Women1/2 Average Risk     3.4          3.3Average Risk          5.0          4.42X Average Risk          9.6  7.13X Average Risk          15.0          11.0                       NonHDL 07/11/2021 67.23   Final   NOTE:  Non-HDL goal should be 30 mg/dL higher than patient's LDL goal (i.e. LDL goal of < 70 mg/dL, would have non-HDL goal of < 100 mg/dL)   Sodium 07/11/2021 139  135 - 145 mEq/L Final   Potassium 07/11/2021 4.0  3.5 - 5.1 mEq/L Final   Chloride 07/11/2021 105  96 - 112 mEq/L Final   CO2 07/11/2021 28  19 - 32 mEq/L Final   Glucose, Bld 07/11/2021 80  70 - 99 mg/dL Final   BUN 07/11/2021 23  6 - 23 mg/dL Final   Creatinine, Ser 07/11/2021 1.39  0.40 - 1.50 mg/dL Final   Total Bilirubin 07/11/2021 0.4  0.2 - 1.2 mg/dL Final   Alkaline Phosphatase 07/11/2021 56  39 - 117 U/L Final   AST 07/11/2021 16  0 - 37 U/L Final   ALT 07/11/2021 16  0 - 53 U/L Final   Total Protein 07/11/2021 7.7  6.0 - 8.3 g/dL Final   Albumin 07/11/2021 4.5  3.5 - 5.2 g/dL Final   GFR 07/11/2021 56.04 (A) >60.00 mL/min Final    Calculated using the CKD-EPI Creatinine Equation (2021)   Calcium 07/11/2021 9.9  8.4 - 10.5 mg/dL Final    Physical Examination:  BP (!) 170/90   Pulse 62   Ht _0  (1.854 m)   Wt 227 lb 6.4 oz (103.1 kg)   SpO2 96%   BMI 30.00 kg/m   Diabetic Foot Exam - Simple   Simple Foot Form Diabetic Foot exam was performed with the following findings: Yes   Visual Inspection No deformities, no ulcerations, no other skin breakdown bilaterally: Yes Sensation Testing Intact to touch and monofilament testing bilaterally: Yes Pulse Check Posterior Tibialis and Dorsalis pulse intact bilaterally: Yes Comments     No ankle edema  ASSESSMENT:  Diabetes type 2, uncontrolled    See history of present illness for detailed discussion of current diabetes management, blood sugar patterns and problems identified  He has had long-standing diabetes, on basal bolus insulin and metformin  A1c is better at 6.7  His current management with the V-go pump, blood sugar patterns from his Dexcom and causes of high and low blood sugars were reviewed in detail Overall has less postprandial hyperglycemia and overall lower blood sugars with using the V-go pump However has tendency to high readings overnight more recently although not consistent, possibly from stopping metformin previously  LIPIDS: Recheck labs today to determine adequacy of his medication  Hypertension: Blood pressure is significantly higher today  Likely needs to have an additional medication such as a diuretic but will wait till his labs are back Also needs to make sure that his home blood pressure monitor is accurate    PLAN:   He will go back to taking metformin at bedtime again With eating less carbohydrate at dinnertime he will reduce his doses by 1 click Emphasized the need to get more carbohydrate as a snack when his blood sugars are trending lower and routinely needs a snack at 8 AM and 4 PM, he can have crackers or fruit He  will need to make sure he times his boluses before starting to eat and not take any  correction boluses within an hour of eating Again will consider checking the cost of the OmniPod next year to see if he can benefit from this Keep adjusting boluses based on portions and carbohydrate content  Blood pressure management as above, labs to be checked include chemistry panel  Reminded him to schedule his diabetic eye exam which is overdue   Patient Instructions  Take a snack at 8 am and 4  pm with crackers or fruit Metformin at bedtime  Take clicks before starting meal    Elayne Snare 07/11/2021, 8:53 PM   Note: This office note was prepared with Dragon voice recognition system technology. Any transcriptional errors that result from this process are unintentional.  Addendum His potassium and renal function is normal, will add HCTZ 12.5 mg daily as also increase Norvasc to 10 mg  have asked him follow-up with PCP in 3 weeks Lipids well controlled

## 2021-07-11 NOTE — Patient Instructions (Addendum)
Take a snack at 8 am and 4  pm with crackers or fruit Metformin at bedtime  Take clicks before starting meal

## 2021-07-20 ENCOUNTER — Encounter: Payer: Self-pay | Admitting: Endocrinology

## 2021-07-29 ENCOUNTER — Other Ambulatory Visit: Payer: Self-pay | Admitting: Endocrinology

## 2021-08-01 ENCOUNTER — Other Ambulatory Visit: Payer: Self-pay | Admitting: Endocrinology

## 2021-08-14 ENCOUNTER — Other Ambulatory Visit: Payer: Self-pay | Admitting: Endocrinology

## 2021-08-15 ENCOUNTER — Other Ambulatory Visit: Payer: Self-pay

## 2021-08-15 DIAGNOSIS — Z794 Long term (current) use of insulin: Secondary | ICD-10-CM

## 2021-08-15 MED ORDER — METFORMIN HCL 500 MG PO TABS: ORAL_TABLET | ORAL | 1 refills | Status: DC

## 2021-08-19 ENCOUNTER — Other Ambulatory Visit: Payer: Self-pay | Admitting: Endocrinology

## 2021-08-25 ENCOUNTER — Other Ambulatory Visit: Payer: Self-pay | Admitting: Endocrinology

## 2021-09-15 ENCOUNTER — Other Ambulatory Visit: Payer: Self-pay | Admitting: Endocrinology

## 2021-09-25 ENCOUNTER — Other Ambulatory Visit: Payer: Self-pay | Admitting: Endocrinology

## 2021-10-03 ENCOUNTER — Other Ambulatory Visit: Payer: Self-pay | Admitting: Endocrinology

## 2021-10-03 DIAGNOSIS — E7800 Pure hypercholesterolemia, unspecified: Secondary | ICD-10-CM

## 2021-10-04 ENCOUNTER — Other Ambulatory Visit: Payer: Self-pay | Admitting: Endocrinology

## 2021-10-04 DIAGNOSIS — Z794 Long term (current) use of insulin: Secondary | ICD-10-CM

## 2021-10-04 DIAGNOSIS — E1165 Type 2 diabetes mellitus with hyperglycemia: Secondary | ICD-10-CM

## 2021-10-10 ENCOUNTER — Ambulatory Visit: Payer: BC Managed Care – PPO | Admitting: Endocrinology

## 2021-11-01 ENCOUNTER — Other Ambulatory Visit: Payer: Self-pay | Admitting: Endocrinology

## 2021-11-01 DIAGNOSIS — E1165 Type 2 diabetes mellitus with hyperglycemia: Secondary | ICD-10-CM

## 2021-11-01 DIAGNOSIS — Z794 Long term (current) use of insulin: Secondary | ICD-10-CM

## 2021-11-15 ENCOUNTER — Other Ambulatory Visit: Payer: Self-pay | Admitting: Endocrinology

## 2021-11-24 ENCOUNTER — Other Ambulatory Visit: Payer: Self-pay | Admitting: Endocrinology

## 2021-11-27 ENCOUNTER — Other Ambulatory Visit: Payer: Self-pay | Admitting: Endocrinology

## 2021-11-27 DIAGNOSIS — E1165 Type 2 diabetes mellitus with hyperglycemia: Secondary | ICD-10-CM

## 2021-12-01 ENCOUNTER — Other Ambulatory Visit: Payer: Self-pay | Admitting: Endocrinology

## 2021-12-01 NOTE — Progress Notes (Signed)
Patient ID: Christian Yang, male   DOB: 04-06-1961, 60 y.o.   MRN: [id number]           Reason for Appointment: Follow-up for Type 2 Diabetes   History of Present Illness:          Date of diagnosis of type 2 diabetes mellitus:   2008       Background history:   He apparently had a weight of 285 pounds at diagnosis He was having dry mouth and flulike symptoms at onset and glucose was 672 He was started on metformin  He had been on Jardiance for 2 years and Bydureon since 6/17 Records indicated that his A1c has been 10-11+ since at least 2015 Levemir insulin was started 3-4 years prior to his consultation but without adequate control   He was started on the V-go pump in 04/2015  Recent history:   INSULIN regimen is: Insulin pump, V-go 20 basal, mealtime clicks 3-4 at breakfast and 3-4 clicks at  lunch and 2-4 at dinner  Non-insulin hypoglycemic drugs the patient is taking are: metformin 0.5g p.m.  Current management, blood sugar patterns and problems identified: A1c is now 7, previously was at 6.7  Compared to his last visit his blood sugars do not have as much tendency to get low  However he still has quite a bit of variability from day-to-day with his postprandial readings especially after breakfast as well as some tendency to low normal or low sugars transiently while at work midmorning and late afternoon  Although he was advised to get a snack at 8 AM and 4 PM to prevent low sugars he does not get a break and only takes glucose tablets if he feels his blood sugars going  Also in the morning at breakfast time he will not take his bolus if the blood sugar is low normal until blood sugars are going up significantly  With certain kind of meals in the morning especially cereal or oatmeal his blood sugars may likely go up significantly after breakfast  However blood sugars are generally better controlled at lunch and dinner  Has continued Fiasp which may have worked better than NovoLog   Now taking 1 tablet of metformin at bedtime, this appears to be helping his overnight blood sugars, has had only 1 low sugar overnight recently   Side effects from diabetes medications have been: None    Sensor description: Dexcom G6  Interpretation of the CGM download as follows  Overall blood sugar variability is not excessive but occurs mostly after breakfast and lunch and late evening  HYPERGLYCEMIC episodes are occurring to variable extent after breakfast and before and after lunchtime as well as periodically late evening after 9:30 PM  OVERNIGHT sugars are generally very stable and evenly controlled with only rarely low or low normal overnight or right before breakfast  On an average highest blood sugars are around 7:30 AM or 11:30 AM POSTPRANDIAL readings fluctuate significantly after breakfast and periodically going over 230 not consistently higher after lunch Postprandial readings after dinner are generally fairly level compared to Premeal readings  HYPOGLYCEMIA has been occurring periodically between 8-10 AM, rarely at 3 AM and also periodically late afternoon especially 5 PM  CGM use % of time 93  2-week average/GV 149/31  Time in range      73% was 79  % Time Above 180 22  % Time above 250 2  % Time Below 70 3.     PRE-MEAL Fasting Lunch  Dinner Bedtime Overall  Glucose range:       Averages: 127 1`22 125     POST-MEAL PC Breakfast PC Lunch PC Dinner  Glucose range:     Averages: 172 182 164   Prior   CGM use % of time 99  2-week average/GV 140/32  Time in range      79, was 67%  % Time Above 180 18  % Time above 250 2  % Time Below 70 3.2       Self-care: The diet that the patient has been following is: tries to limit High-fat foods .     Typical meal intake: Breakfast is cereal, sometimes oatmeal or biscuits, lunch is a sandwich and fruit. Lunch 1 pm Dinner is baked chicken, salad and rice at 7 pm .   Snacks are on Peanut butter and crackers, having  this at 10. 30 a.m. at work                Dietician visit, most recent: At diagnosis only CDE visit: 7/17               Exercise: On his feet at work.    Weight history: Maximum 285  Wt Readings from Last 3 Encounters:  12/02/21 241 lb (109.3 kg)  07/11/21 227 lb 6.4 oz (103.1 kg)  04/07/21 222 lb 3.2 oz (100.8 kg)    Glycemic control:   Lab Results  Component Value Date   HGBA1C 6.7 (A) 07/11/2021   HGBA1C 7.0 (A) 04/07/2021   HGBA1C 7.0 (A) 01/06/2021   Lab Results  Component Value Date   MICROALBUR 1.8 01/06/2021   LDLCALC 54 07/11/2021   CREATININE 1.39 07/11/2021   Lab Results  Component Value Date   MICRALBCREAT 0.8 01/06/2021       Allergies as of 12/02/2021       Reactions   Penicillins Rash        Medication List        Accurate as of December 02, 2021  8:22 AM. If you have any questions, ask your nurse or doctor.          amLODipine 10 MG tablet Commonly known as: NORVASC TAKE 1 TABLET BY MOUTH EVERY DAY   Dexcom G6 Receiver Devi Use as directed Check sugar daily. E11.65   Dexcom G6 Sensor Misc USE TO MONITOR BLOOD SUGAR, CHANGE AFTER 10 DAYS   Dexcom G6 Transmitter Misc USE AS DIRECTED   Fiasp 100 UNIT/ML Soln Generic drug: Insulin Aspart (w/Niacinamide) USE MAX 65 UNITS WITH V-GO INSULIN PUMP DAILY   glucose blood test strip Commonly known as: Contour Next Test 1 each by Other route as needed for other. Check blood sugar before all meals and at HS. Check 2 hours after a meal 5 times per week.   Contour Next Test test strip Generic drug: glucose blood CHECK BLOOD SUGAR 2x daily   hydrochlorothiazide 12.5 MG capsule Commonly known as: MICROZIDE Take 1 capsule (12.5 mg total) by mouth daily.   metFORMIN 500 MG tablet Commonly known as: GLUCOPHAGE TAKE 1 TABLET BY MOUTH AT BEDTIME   omeprazole 40 MG capsule Commonly known as: PRILOSEC Take 40 mg by mouth daily.   OneTouch Delica Lancets 52Y Misc Use to check blood  sugar 3 times per day.   rosuvastatin 5 MG tablet Commonly known as: CRESTOR TAKE 1 TABLET (5 MG TOTAL) BY MOUTH DAILY.   V-Go 20 Kit USE AS DIRECTED  Allergies:  Allergies  Allergen Reactions   Penicillins Rash    Past Medical History:  Diagnosis Date   Diabetes mellitus without complication (HCC)    GERD (gastroesophageal reflux disease)    High cholesterol    History of kidney stones     Past Surgical History:  Procedure Laterality Date   CARPAL TUNNEL RELEASE Bilateral    10 +years ago   ESOPHAGOGASTRODUODENOSCOPY  2000   stretching of esophagus   EXTRACORPOREAL SHOCK WAVE LITHOTRIPSY Right 04/08/2018   Procedure: RIGHT EXTRACORPOREAL SHOCK WAVE LITHOTRIPSY (ESWL);  Surgeon: Cleon Gustin, MD;  Location: [facility];  Service: Urology;  Laterality: Right;    Family History  Problem Relation Age of Onset   Heart disease Mother    Diabetes Mother    Diabetes Sister    Diabetes Maternal Grandmother     Social History:  reports that he has never smoked. He has never used smokeless tobacco. He reports that he does not drink alcohol and does not use drugs.   Review of Systems    Lipid history: LDL usually controlled with Crestor 5 mg    Lab Results  Component Value Date   CHOL 136 07/11/2021   HDL 68.70 07/11/2021   LDLCALC 54 07/11/2021   TRIG 65.0 07/11/2021   CHOLHDL 2 07/11/2021           Hypertension: Is taking amlodipine 10 mg only currently Losartan was not started because of abnormal creatinine at baseline  Only occasionally checking blood pressure at home, not checked for couple of weeks at least and previously 130s/80 with wrist meter Blood pressure was high check twice today  Home 140  BP Readings from Last 3 Encounters:  12/02/21 (!) 164/84  07/11/21 (!) 170/90  04/07/21 (!) 144/82    Has renal function abnormality without diabetic nephropathy with variable levels Also has history of kidney stone which he  thinks he may have passed in 12/21  Lab Results  Component Value Date   CREATININE 1.39 07/11/2021   CREATININE 1.28 01/06/2021   CREATININE 1.51 (H) 10/04/2020   Lab Results  Component Value Date   K 4.0 07/11/2021     Most recent eye exam was 6/18 and he has not scheduled follow-up  Most recent foot exam: 9/22   Physical Examination:  BP (!) 164/84    Pulse 76    Ht _0  (1.854 m)    Wt 241 lb (109.3 kg)    SpO2 96%    BMI 31.80 kg/m    ASSESSMENT:  Diabetes type 2, uncontrolled    See history of present illness for detailed discussion of current diabetes management, blood sugar patterns and problems identified  He has had long-standing diabetes, on basal bolus insulin and metformin  A1c is 7%    Hypertension: Blood pressure is again persistently higher Not clear if his home monitor is accurate or if he has significant whitecoat syndrome Not clear also why he had intolerance to HCTZ which is nonspecific and not apparently associated with low blood pressure reading Has not been given ARB drug because of tendency to rise in creatinine with this   PLAN:    He needs to consistently have protein at breakfast especially when eating oatmeal or cereal  Also he needs to try and bolus right before eating consistently at breakfast and if his blood sugars are low he can raise them quickly with some juice  May also need separate 1 click for drinking coffee in the  morning before breakfast  Try to have a snack around 8 AM-8:30 AM to prevent low sugars while at work  May go up an extra click for his boluses if blood sugars are high before lunch or dinner and if eating a larger meal  Discussed in detail the potential for using the OmniPod 5 system as well as the closed-loop technology, showed him the demonstration unit for the OmniPod and how it would be used in addition to his Dexcom  Patient information brochure given Will also send a prescription to the mail order supplier to  estimate his coverage  Continue metformin unchanged  For his hypertension he needs to try the HCTZ again but try taking it in the evening instead of at the same time as amlodipine Also empirically can reduce his amlodipine to half a tablet for now Does need to follow-up with his PCP in about a month We will recheck renal function and electrolytes today Also needs urine microalbumin  There are no Patient Instructions on file for this visit.    Elayne Snare 12/02/2021, 8:22 AM   Note: This office note was prepared with Dragon voice recognition system technology. Any transcriptional errors that result from this process are unintentional.

## 2021-12-02 ENCOUNTER — Ambulatory Visit (INDEPENDENT_AMBULATORY_CARE_PROVIDER_SITE_OTHER): Payer: 59 | Admitting: Endocrinology

## 2021-12-02 ENCOUNTER — Other Ambulatory Visit: Payer: Self-pay

## 2021-12-02 ENCOUNTER — Encounter: Payer: Self-pay | Admitting: Endocrinology

## 2021-12-02 VITALS — BP 164/84 | HR 76 | Ht 73.0 in | Wt 241.0 lb

## 2021-12-02 DIAGNOSIS — E1165 Type 2 diabetes mellitus with hyperglycemia: Secondary | ICD-10-CM

## 2021-12-02 DIAGNOSIS — Z794 Long term (current) use of insulin: Secondary | ICD-10-CM | POA: Diagnosis not present

## 2021-12-02 DIAGNOSIS — I10 Essential (primary) hypertension: Secondary | ICD-10-CM

## 2021-12-02 LAB — BASIC METABOLIC PANEL
BUN: 24 mg/dL — ABNORMAL HIGH (ref 6–23)
CO2: 28 mEq/L (ref 19–32)
Calcium: 9.2 mg/dL (ref 8.4–10.5)
Chloride: 105 mEq/L (ref 96–112)
Creatinine, Ser: 1.35 mg/dL (ref 0.40–1.50)
GFR: 57.88 mL/min — ABNORMAL LOW (ref >60.00)
Glucose, Bld: 51 mg/dL — ABNORMAL LOW (ref 70–99)
Potassium: 4.2 mEq/L (ref 3.5–5.1)
Sodium: 140 mEq/L (ref 135–145)

## 2021-12-02 LAB — POCT GLYCOSYLATED HEMOGLOBIN (HGB A1C): Hemoglobin A1C: 7 % — AB (ref 4.0–5.6)

## 2021-12-02 LAB — MICROALBUMIN / CREATININE URINE RATIO
Creatinine,U: 179.9 mg/dL
Microalb Creat Ratio: 0.5 mg/g (ref 0.0–30.0)
Microalb, Ur: 1 mg/dL (ref 0.0–1.9)

## 2021-12-02 MED ORDER — OMNIPOD 5 DEXG7G6 PODS GEN 5 MISC: 1.0000 | 3 refills | Status: DC

## 2021-12-02 MED ORDER — OMNIPOD 5 DEXG7G6 INTRO GEN 5 KIT: 1.0000 | PACK | Freq: Once | 0 refills | Status: AC

## 2021-12-02 NOTE — Patient Instructions (Addendum)
Take clicks before starting to eat in am, if sugar <80 have juice to bring it up  Take 1/2 amlodipine in am and hctz at dinner, see Dr Izola Price

## 2021-12-13 ENCOUNTER — Encounter: Payer: Self-pay | Admitting: Endocrinology

## 2021-12-19 ENCOUNTER — Other Ambulatory Visit: Payer: Self-pay | Admitting: Endocrinology

## 2021-12-24 ENCOUNTER — Other Ambulatory Visit: Payer: Self-pay | Admitting: Endocrinology

## 2021-12-24 DIAGNOSIS — E1165 Type 2 diabetes mellitus with hyperglycemia: Secondary | ICD-10-CM

## 2021-12-24 DIAGNOSIS — Z794 Long term (current) use of insulin: Secondary | ICD-10-CM

## 2021-12-26 ENCOUNTER — Other Ambulatory Visit: Payer: Self-pay | Admitting: Endocrinology

## 2021-12-26 DIAGNOSIS — E1165 Type 2 diabetes mellitus with hyperglycemia: Secondary | ICD-10-CM

## 2021-12-26 DIAGNOSIS — Z794 Long term (current) use of insulin: Secondary | ICD-10-CM

## 2022-01-24 ENCOUNTER — Other Ambulatory Visit: Payer: Self-pay | Admitting: Endocrinology

## 2022-01-24 DIAGNOSIS — E1165 Type 2 diabetes mellitus with hyperglycemia: Secondary | ICD-10-CM

## 2022-02-19 ENCOUNTER — Other Ambulatory Visit: Payer: Self-pay | Admitting: Endocrinology

## 2022-03-10 ENCOUNTER — Encounter: Payer: Self-pay | Admitting: Endocrinology

## 2022-03-10 ENCOUNTER — Ambulatory Visit (INDEPENDENT_AMBULATORY_CARE_PROVIDER_SITE_OTHER): Payer: 59 | Admitting: Endocrinology

## 2022-03-10 VITALS — BP 170/80 | HR 86 | Ht 73.0 in | Wt 240.0 lb

## 2022-03-10 DIAGNOSIS — E7800 Pure hypercholesterolemia, unspecified: Secondary | ICD-10-CM

## 2022-03-10 DIAGNOSIS — Z794 Long term (current) use of insulin: Secondary | ICD-10-CM

## 2022-03-10 DIAGNOSIS — I10 Essential (primary) hypertension: Secondary | ICD-10-CM | POA: Diagnosis not present

## 2022-03-10 DIAGNOSIS — E1165 Type 2 diabetes mellitus with hyperglycemia: Secondary | ICD-10-CM

## 2022-03-10 LAB — COMPREHENSIVE METABOLIC PANEL
ALT: 19 U/L (ref 0–53)
AST: 19 U/L (ref 0–37)
Albumin: 4.6 g/dL (ref 3.5–5.2)
Alkaline Phosphatase: 65 U/L (ref 39–117)
BUN: 23 mg/dL (ref 6–23)
CO2: 27 mEq/L (ref 19–32)
Calcium: 9.7 mg/dL (ref 8.4–10.5)
Chloride: 105 mEq/L (ref 96–112)
Creatinine, Ser: 1.4 mg/dL (ref 0.40–1.50)
GFR: 55.3 mL/min — ABNORMAL LOW (ref >60.00)
Glucose, Bld: 125 mg/dL — ABNORMAL HIGH (ref 70–99)
Potassium: 4.5 mEq/L (ref 3.5–5.1)
Sodium: 139 mEq/L (ref 135–145)
Total Bilirubin: 0.4 mg/dL (ref 0.2–1.2)
Total Protein: 7.6 g/dL (ref 6.0–8.3)

## 2022-03-10 LAB — POCT GLYCOSYLATED HEMOGLOBIN (HGB A1C): Hemoglobin A1C: 6.7 % — AB (ref 4.0–5.6)

## 2022-03-10 LAB — LIPID PANEL
Cholesterol: 129 mg/dL (ref 0–200)
HDL: 55.9 mg/dL (ref >39.00)
LDL Cholesterol: 59 mg/dL (ref 0–99)
NonHDL: 73.48
Total CHOL/HDL Ratio: 2
Triglycerides: 72 mg/dL (ref 0.0–149.0)
VLDL: 14.4 mg/dL (ref 0.0–40.0)

## 2022-03-10 MED ORDER — HYDROCHLOROTHIAZIDE 12.5 MG PO CAPS: 12.5000 mg | ORAL_CAPSULE | Freq: Every day | ORAL | 3 refills | Status: DC

## 2022-03-10 NOTE — Patient Instructions (Signed)
Change pump at dinner, may hold if sugar low

## 2022-03-10 NOTE — Progress Notes (Signed)
Patient ID: Christian Yang, male   DOB: 1960/10/28, 61 y.o.   MRN: [id number]           Reason for Appointment: Follow-up for Type 2 Diabetes   History of Present Illness:          Date of diagnosis of type 2 diabetes mellitus:   2008       Background history:   He apparently had a weight of 285 pounds at diagnosis He was having dry mouth and flulike symptoms at onset and glucose was 672 He was started on metformin  He had been on Jardiance for 2 years and Bydureon since 6/17 Records indicated that his A1c has been 10-11+ since at least 2015 Levemir insulin was started 3-4 years prior to his consultation but without adequate control   He was started on the V-go pump in 04/2015  Recent history:   INSULIN regimen is: Insulin pump, V-go 20 basal, mealtime clicks 3-4 at breakfast and 3-4 clicks at  lunch and 2-4 at dinner  Non-insulin hypoglycemic drugs the patient is taking are: metformin 0.5g p.m.  Current management, blood sugar patterns and problems identified: A1c is back to 6.7  He was recommended OmniPod 5 pump but he does not want to change and prefers to stay on the V-go  Most of this is related to expense He changes his pump regularly at bedtime  Although overnight he has not had hypoglycemia he tends to have low sugars mostly before dinner especially when he is more active Also if he has an afternoon snack he will take 1 click bolus  Although blood sugars tend to rise after breakfast over 180 average she usually comes down fairly quickly midmorning with occasional hypoglycemia also  Usually able to control postprandial readings at lunch and dinner but he is likely adjusting boluses based on his Premeal trend sugars  Also with delayed boluses at suppertime he may sometimes have higher readings  He is mildly active at work Weight is about the same   Side effects from diabetes medications have been: None    Sensor description: Dexcom G6  Interpretation of the CGM  download as follows  Compared to the last visit he has less hypoglycemia with 2% of blood sugars below 70 compared to 3% and usually also transient; also HYPERGLYCEMIA is less frequent  HIGHEST blood sugars are still after breakfast HYPERGLYCEMIC episodes are occurring mostly after breakfast with peak blood sugar on an average 183 and occasionally midday  Sensor usage 93% OVERNIGHT sugars are slightly variable and decrease gradually until morning without hypoglycemia  POSTPRANDIAL readings as above Rybelsus modestly after breakfast on an average from baseline of about 115  Blood sugars after lunch are relatively even compared to Premeal readings as also dinnertime  However occasionally HYPOGLYCEMIA after after dinner   HYPOGLYCEMIA has been occurring occasionally between 10 AM and 12 noon as well as 4-6 PM before dinner and rarely after 8 PM and usually transiently   CGM use % of time 93  2-week average/GV 143 versus 149, AST 44  Time in range     78 versus 73  % Time Above 180 18  % Time above 250 2  % Time Below 70 1 +1      Self-care: The diet that the patient has been following is: tries to limit High-fat foods .     Typical meal intake: Breakfast is cereal, sometimes oatmeal or biscuits, lunch is a sandwich and fruit. Lunch 1 pm  Dinner is baked chicken, salad and rice at 7 pm .   Snacks are on Peanut butter and crackers, having this at 10. 30 a.m. at work                Dietician visit, most recent: At diagnosis only CDE visit: 7/17                 Weight history: Maximum 285  Wt Readings from Last 3 Encounters:  03/10/22 240 lb (108.9 kg)  12/02/21 241 lb (109.3 kg)  07/11/21 227 lb 6.4 oz (103.1 kg)    Glycemic control:   Lab Results  Component Value Date   HGBA1C 6.7 (A) 03/10/2022   HGBA1C 7.0 (A) 12/02/2021   HGBA1C 6.7 (A) 07/11/2021   Lab Results  Component Value Date   MICROALBUR 1.0 12/02/2021   LDLCALC 54 07/11/2021   CREATININE 1.35 12/02/2021    Lab Results  Component Value Date   MICRALBCREAT 0.5 12/02/2021       Allergies as of 03/10/2022       Reactions   Penicillins Rash        Medication List        Accurate as of Mar 10, 2022  8:40 AM. If you have any questions, ask your nurse or doctor.          amLODipine 10 MG tablet Commonly known as: NORVASC TAKE 1 TABLET BY MOUTH EVERY DAY   Dexcom G6 Receiver Devi Use as directed Check sugar daily. E11.65   Dexcom G6 Sensor Misc USE TO MONITOR BLOOD SUGAR, CHANGE AFTER 10 DAYS   Dexcom G6 Transmitter Misc USE AS DIRECTED   Fiasp 100 UNIT/ML Soln Generic drug: Insulin Aspart (w/Niacinamide) USE MAX 65 UNITS WITH V-GO INSULIN PUMP DAILY   glucose blood test strip Commonly known as: Contour Next Test 1 each by Other route as needed for other. Check blood sugar before all meals and at HS. Check 2 hours after a meal 5 times per week.   Contour Next Test test strip Generic drug: glucose blood CHECK BLOOD SUGAR 2x daily   hydrochlorothiazide 12.5 MG capsule Commonly known as: MICROZIDE Take 1 capsule (12.5 mg total) by mouth daily.   metFORMIN 500 MG tablet Commonly known as: GLUCOPHAGE TAKE 1 TABLET BY MOUTH AT BEDTIME . APPOINTMENT REQUIRED FOR FUTURE REFILLS   omeprazole 40 MG capsule Commonly known as: PRILOSEC Take 40 mg by mouth daily.   OneTouch Delica Lancets 80D Misc Use to check blood sugar 3 times per day.   rosuvastatin 5 MG tablet Commonly known as: CRESTOR TAKE 1 TABLET (5 MG TOTAL) BY MOUTH DAILY.   V-Go 20 Kit USE AS DIRECTED   Omnipod 5 G6 Pod (Gen 5) Misc 1 Device by Does not apply route every 3 (three) days.        Allergies:  Allergies  Allergen Reactions   Penicillins Rash    Past Medical History:  Diagnosis Date   Diabetes mellitus without complication (HCC)    GERD (gastroesophageal reflux disease)    High cholesterol    History of kidney stones     Past Surgical History:  Procedure Laterality Date    CARPAL TUNNEL RELEASE Bilateral    10 +years ago   ESOPHAGOGASTRODUODENOSCOPY  2000   stretching of esophagus   EXTRACORPOREAL SHOCK WAVE LITHOTRIPSY Right 04/08/2018   Procedure: RIGHT EXTRACORPOREAL SHOCK WAVE LITHOTRIPSY (ESWL);  Surgeon: Cleon Gustin, MD;  Location: [facility];  Service: Urology;  Laterality:  Right;    Family History  Problem Relation Age of Onset   Heart disease Mother    Diabetes Mother    Diabetes Sister    Diabetes Maternal Grandmother     Social History:  reports that he has never smoked. He has never used smokeless tobacco. He reports that he does not drink alcohol and does not use drugs.   Review of Systems    Lipid history: LDL controlled with Crestor 5 mg    Lab Results  Component Value Date   CHOL 136 07/11/2021   HDL 68.70 07/11/2021   LDLCALC 54 07/11/2021   TRIG 65.0 07/11/2021   CHOLHDL 2 07/11/2021           Hypertension: Is taking amlodipine 10 mg only currently He was told to start HCTZ on his last visit but has not done so and blood pressure is consistently high He has not checked material at home recently No edema  BP Readings from Last 3 Encounters:  03/10/22 (!) 170/80  12/02/21 (!) 164/84  07/11/21 (!) 170/90    Has mild renal dysfunction without diabetic nephropathy with variable levels Also has history of kidney stone which he thinks he may have passed in 12/21  Lab Results  Component Value Date   CREATININE 1.35 12/02/2021   CREATININE 1.39 07/11/2021   CREATININE 1.28 01/06/2021   Lab Results  Component Value Date   K 4.2 12/02/2021     Most recent eye exam was 6/18 and he has not scheduled follow-up  Most recent foot exam: 9/22   Physical Examination:  BP (!) 170/80   Pulse 86   Ht '6\' 1"'  (1.854 m)   Wt 240 lb (108.9 kg)   SpO2 96%   BMI 31.66 kg/m    ASSESSMENT:  Diabetes type 2, uncontrolled    See history of present illness for detailed discussion of current diabetes management,  blood sugar patterns and problems identified  He has had long-standing diabetes, on basal bolus insulin and metformin  A1c is now 6.7%  He is on basal bolus insulin with the V-go pump and also metformin  Blood sugar management was discussed in detail as well as prevention of hypoglycemia and better management of mealtime boluses  Hypertension: Blood pressure is persistently higher Not clear why he did not start HCTZ when he was prescribed this and likely will need at least 2 drugs because of his significant systolic hypertension Has not been given ARB drug because of tendency to rise in creatinine with this   PLAN:    He will not cover his afternoon snack with a bolus to avoid low sugars at work Discussed appropriate treatment of hypoglycemia with simple sugars rather than food Have a protein at breakfast consistently If his blood sugar is low before dinnertime he can bolus right after finishing eating and not later He will try to adjust his bolus doses based on total meal size and carbohydrate intake rather than just Premeal blood sugar May benefit from timing his pump changed to before dinner instead of bedtime If his blood sugars are relatively low before dinner he can delay changing his pump Continue metformin same doses  For his hypertension he needs to try the HCTZ and will send another prescription If he starts getting relatively low blood pressure readings he will cut his amlodipine in half but let us know Start checking blood pressure regularly at home  Cholesterol management: He will have labs done today  There are no  Patient Instructions on file for this visit.    Elayne Snare 03/10/2022, 8:40 AM   Note: This office note was prepared with Dragon voice recognition system technology. Any transcriptional errors that result from this process are unintentional.  Addendum: Creatinine 1.4, likely related to hypertension, cholesterol normal

## 2022-03-28 ENCOUNTER — Other Ambulatory Visit: Payer: Self-pay | Admitting: Endocrinology

## 2022-04-14 ENCOUNTER — Other Ambulatory Visit: Payer: Self-pay | Admitting: Endocrinology

## 2022-04-14 DIAGNOSIS — E1165 Type 2 diabetes mellitus with hyperglycemia: Secondary | ICD-10-CM

## 2022-04-16 ENCOUNTER — Other Ambulatory Visit: Payer: Self-pay | Admitting: Endocrinology

## 2022-04-16 DIAGNOSIS — E7800 Pure hypercholesterolemia, unspecified: Secondary | ICD-10-CM

## 2022-05-08 ENCOUNTER — Other Ambulatory Visit: Payer: Self-pay | Admitting: Endocrinology

## 2022-05-16 ENCOUNTER — Other Ambulatory Visit: Payer: Self-pay | Admitting: Endocrinology

## 2022-06-07 ENCOUNTER — Other Ambulatory Visit: Payer: Self-pay | Admitting: Endocrinology

## 2022-06-19 ENCOUNTER — Other Ambulatory Visit: Payer: Self-pay | Admitting: Endocrinology

## 2022-06-21 ENCOUNTER — Other Ambulatory Visit: Payer: Self-pay | Admitting: Endocrinology

## 2022-07-14 ENCOUNTER — Encounter: Payer: Self-pay | Admitting: Endocrinology

## 2022-07-14 ENCOUNTER — Ambulatory Visit (INDEPENDENT_AMBULATORY_CARE_PROVIDER_SITE_OTHER): Payer: 59 | Admitting: Endocrinology

## 2022-07-14 VITALS — BP 156/78 | HR 78 | Ht 73.0 in | Wt 240.0 lb

## 2022-07-14 DIAGNOSIS — Z794 Long term (current) use of insulin: Secondary | ICD-10-CM

## 2022-07-14 DIAGNOSIS — I10 Essential (primary) hypertension: Secondary | ICD-10-CM | POA: Diagnosis not present

## 2022-07-14 DIAGNOSIS — E1165 Type 2 diabetes mellitus with hyperglycemia: Secondary | ICD-10-CM

## 2022-07-14 LAB — COMPREHENSIVE METABOLIC PANEL
ALT: 15 U/L (ref 0–53)
AST: 17 U/L (ref 0–37)
Albumin: 4.2 g/dL (ref 3.5–5.2)
Alkaline Phosphatase: 70 U/L (ref 39–117)
BUN: 20 mg/dL (ref 6–23)
CO2: 27 mEq/L (ref 19–32)
Calcium: 9.2 mg/dL (ref 8.4–10.5)
Chloride: 104 mEq/L (ref 96–112)
Creatinine, Ser: 1.37 mg/dL (ref 0.40–1.50)
GFR: 56.62 mL/min — ABNORMAL LOW (ref >60.00)
Glucose, Bld: 154 mg/dL — ABNORMAL HIGH (ref 70–99)
Potassium: 4.1 mEq/L (ref 3.5–5.1)
Sodium: 138 mEq/L (ref 135–145)
Total Bilirubin: 0.5 mg/dL (ref 0.2–1.2)
Total Protein: 7.4 g/dL (ref 6.0–8.3)

## 2022-07-14 LAB — POCT GLYCOSYLATED HEMOGLOBIN (HGB A1C): Hemoglobin A1C: 6.9 % — AB (ref 4.0–5.6)

## 2022-07-14 NOTE — Patient Instructions (Addendum)
[facility] app  on phone   Get report of eye exam

## 2022-07-14 NOTE — Progress Notes (Signed)
Patient ID: Christian Yang, male   DOB: Jan 02, 1961, 61 y.o.   MRN: [id number]           Reason for Appointment: Follow-up for Type 2 Diabetes   History of Present Illness:          Date of diagnosis of type 2 diabetes mellitus:   2008       Background history:   He apparently had a weight of 285 pounds at diagnosis He was having dry mouth and flulike symptoms at onset and glucose was 672 He was started on metformin  He had been on Jardiance for 2 years and Bydureon since 6/17 Records indicated that his A1c has been 10-11+ since at least 2015 Levemir insulin was started 3-4 years prior to his consultation but without adequate control   He was started on the V-go pump in 04/2015  Recent history:   INSULIN regimen is: Insulin pump, V-go 20 basal, mealtime clicks 3-4 at breakfast and 3-4 clicks at  lunch and 2-4 at dinner  Non-insulin hypoglycemic drugs the patient is taking are: metformin 0.5g p.m.  Current management, blood sugar patterns and problems identified: A1c is 6.9 compared to 6.7  He continues to V-go pump Appears that he does not have any consistent time to change his pump, on the previous visit was recommended taking 2 of the old pump at dinnertime for consistency but now he is generally doing in the morning or whenever he remembers to do it during the day However his Premeal blood sugars appear to be relatively steady As before his highest blood sugars are after breakfast This is despite having some protein in the morning like eggs He still has tendency to hypoglycemia before lunch and dinner related to his activity level and does not appear to be proactively preventing this with snacks especially when working at home such as lawn mowing Taking the same amount of boluses as before Most of his blood sugars overall after it meals are fairly good with occasional excessive rice after lunch or dinner based on his diet Currently with a 20 unit based on his overnight blood  sugars relatively stable AVERAGING about 130-140 Other information from the CGM as follows  Side effects from diabetes medications have been: None    Sensor description: Dexcom G6  Interpretation of the CGM download as follows  HYPERGLYCEMIA is seen after meals mostly with relatively higher blood sugar spikes after breakfast and week #1 compared to the last week and also occasionally after other meals and rarely overnight   HIGHEST blood sugars are as before after breakfast, AVERAGING 208   HYPERGLYCEMIC episodes are occurring mostly after breakfast with peak blood sugar on an average 183 and occasionally midday  Sensor usage 93% OVERNIGHT sugars are slightly variable and decrease gradually until morning without hypoglycemia  POSTPRANDIAL readings as above go up variably after breakfast as discussed above with higher readings in the low 200 range in the first week and less in the last week   Blood sugars after lunch are not rising compared to Premeal readings and staying even or coming down Glucose excursions after dinner are mostly controlled with only occasional significant rise over 200 with highest reading about 300 HYPOGLYCEMIA has been occurring periodically midday or late afternoon around 6 PM and rarely after dinner   CGM use % of time 93  2-week average 144+/-49  Time in range 76, was 78       %  % Time Above 180 17+3  %  Time above 250   % Time Below 70 3+1   Previously  CGM use % of time 93  2-week average/GV 143 versus 149, AST 44  Time in range     78 versus 73  % Time Above 180 18  % Time above 250 2  % Time Below 70 1 +1      Self-care: The diet that the patient has been following is: tries to limit High-fat foods .     Typical meal intake: Breakfast is cereal, sometimes oatmeal or biscuits, lunch is a sandwich and fruit. Lunch 1 pm Dinner is baked chicken, salad and rice at 7 pm .   Snacks are on Peanut butter and crackers, having this at 10. 30 a.m. at  work                Dietician visit, most recent: At diagnosis only CDE visit: 7/17                 Weight history: Maximum 285  Wt Readings from Last 3 Encounters:  07/14/22 240 lb (108.9 kg)  03/10/22 240 lb (108.9 kg)  12/02/21 241 lb (109.3 kg)    Glycemic control:   Lab Results  Component Value Date   HGBA1C 6.9 (A) 07/14/2022   HGBA1C 6.7 (A) 03/10/2022   HGBA1C 7.0 (A) 12/02/2021   Lab Results  Component Value Date   MICROALBUR 1.0 12/02/2021   LDLCALC 59 03/10/2022   CREATININE 1.37 07/14/2022   Lab Results  Component Value Date   MICRALBCREAT 0.5 12/02/2021       Allergies as of 07/14/2022       Reactions   Penicillins Rash        Medication List        Accurate as of July 14, 2022  2:41 PM. If you have any questions, ask your nurse or doctor.          amLODipine 10 MG tablet Commonly known as: NORVASC TAKE 1 TABLET BY MOUTH EVERY DAY   Dexcom G6 Receiver Devi Use as directed Check sugar daily. E11.65   Dexcom G6 Sensor Misc USE TO MONITOR BLOOD SUGAR, CHANGE AFTER 10 DAYS   Dexcom G6 Transmitter Misc USE AS DIRECTED   Fiasp 100 UNIT/ML Soln Generic drug: Insulin Aspart (w/Niacinamide) USE MAX 65 UNITS WITH V-GO INSULIN PUMP DAILY   glucose blood test strip Commonly known as: Contour Next Test 1 each by Other route as needed for other. Check blood sugar before all meals and at HS. Check 2 hours after a meal 5 times per week.   Contour Next Test test strip Generic drug: glucose blood CHECK BLOOD SUGAR 2x daily   hydrochlorothiazide 12.5 MG capsule Commonly known as: MICROZIDE TAKE 1 CAPSULE BY MOUTH DAILY AT DINNER.   metFORMIN 500 MG tablet Commonly known as: GLUCOPHAGE TAKE 1 TABLET BY MOUTH AT BEDTIME . APPOINTMENT REQUIRED FOR FUTURE REFILLS   omeprazole 40 MG capsule Commonly known as: PRILOSEC Take 40 mg by mouth daily.   OneTouch Delica Lancets 42A Misc Use to check blood sugar 3 times per day.    rosuvastatin 5 MG tablet Commonly known as: CRESTOR TAKE 1 TABLET (5 MG TOTAL) BY MOUTH DAILY.   V-Go 20 20 UNIT/24HR Kit USE AS DIRECTED        Allergies:  Allergies  Allergen Reactions   Penicillins Rash    Past Medical History:  Diagnosis Date   Diabetes mellitus without complication ([place])  GERD (gastroesophageal reflux disease)    High cholesterol    History of kidney stones     Past Surgical History:  Procedure Laterality Date   CARPAL TUNNEL RELEASE Bilateral    10 +years ago   ESOPHAGOGASTRODUODENOSCOPY  2000   stretching of esophagus   EXTRACORPOREAL SHOCK WAVE LITHOTRIPSY Right 04/08/2018   Procedure: RIGHT EXTRACORPOREAL SHOCK WAVE LITHOTRIPSY (ESWL);  Surgeon: Cleon Gustin, MD;  Location: [facility];  Service: Urology;  Laterality: Right;    Family History  Problem Relation Age of Onset   Heart disease Mother    Diabetes Mother    Diabetes Sister    Diabetes Maternal Grandmother     Social History:  reports that he has never smoked. He has never used smokeless tobacco. He reports that he does not drink alcohol and does not use drugs.   Review of Systems    Lipid history: LDL controlled with Crestor 5 mg    Lab Results  Component Value Date   CHOL 129 03/10/2022   HDL 55.90 03/10/2022   LDLCALC 59 03/10/2022   TRIG 72.0 03/10/2022   CHOLHDL 2 03/10/2022           Hypertension: Is taking amlodipine 10 mg only currently He was told to start HCTZ on his last visit but has not done so and blood pressure is consistently high He has checked 130-140 No edema  BP Readings from Last 3 Encounters:  07/14/22 (!) 156/78  03/10/22 (!) 170/80  12/02/21 (!) 164/84    Has mild renal dysfunction without diabetic nephropathy with variable levels Also has history of kidney stone which he thinks he may have passed in 12/21  Lab Results  Component Value Date   CREATININE 1.37 07/14/2022   CREATININE 1.40 03/10/2022   CREATININE 1.35  12/02/2021   Lab Results  Component Value Date   K 4.1 07/14/2022     Most recent eye exam was ?  Most recent foot exam: 9/22   Physical Examination:  BP (!) 156/78 (BP Location: Left Arm, Patient Position: Sitting, Cuff Size: Normal)   Pulse 78   Ht '6\' 1"'  (1.854 m)   Wt 240 lb (108.9 kg)   SpO2 97%   BMI 31.66 kg/m    ASSESSMENT:  Diabetes type 2, uncontrolled    See history of present illness for detailed discussion of current diabetes management, blood sugar patterns and problems identified  He has had long-standing diabetes, on basal bolus insulin and metformin  A1c is now 6.9%  He is on basal bolus insulin with the V-go pump and also low-dose metformin  His blood sugar management is still suboptimal with some tendency to hypoglycemia when he is more active during the day Also has periodic postprandial hyperglycemia when he is not estimating his bolus for carbohydrates adequately Again this is not consistent Also not consistent with the time of changing of his pump  Hypertension: Blood pressure is improved at home but higher in the office as before with some whitecoat syndrome He has done better with adding HCTZ Has not been given ARB drug because of tendency to rise in creatinine with this   PLAN:    He will take additional 1-2 clicks for high carbohydrate meals including at breakfast or when eating out  Change his pump consistently every 24 hours at a more convenient time which he can remember and can set up an alert on his phone  Consider only 0.5 again and will send a prescription to  see how much his coverage is with the new insurance Given him the information on registering with the website if he does start this Again explained to him that this will help avoid hypoglycemia In the meantime he needs to have a carbohydrate snack or juice before he plans to be very active Make sure he is bolusing ahead of time before eating  No change in  antihypertensives  Chemistry panel to be checked today  He will have his eye exam report sent to [place]  Patient Instructions  Clarity app  on phone   Get report of eye exam    Elayne Snare 07/14/2022, 2:41 PM   Note: This office note was prepared with Dragon voice recognition system technology. Any transcriptional errors that result from this process are unintentional.  Addendum: Creatinine slightly better at 1.37 otherwise normal chemistry

## 2022-07-19 ENCOUNTER — Encounter: Payer: Self-pay | Admitting: Endocrinology

## 2022-07-20 ENCOUNTER — Other Ambulatory Visit: Payer: Self-pay | Admitting: Endocrinology

## 2022-07-24 ENCOUNTER — Other Ambulatory Visit: Payer: Self-pay

## 2022-07-24 DIAGNOSIS — E1165 Type 2 diabetes mellitus with hyperglycemia: Secondary | ICD-10-CM

## 2022-07-24 MED ORDER — DEXCOM G7 SENSOR MISC: 2 refills | Status: DC

## 2022-07-27 ENCOUNTER — Other Ambulatory Visit: Payer: Self-pay

## 2022-07-27 DIAGNOSIS — E1165 Type 2 diabetes mellitus with hyperglycemia: Secondary | ICD-10-CM

## 2022-07-27 MED ORDER — DEXCOM G7 RECEIVER DEVI: 0 refills | Status: DC

## 2022-09-17 ENCOUNTER — Other Ambulatory Visit: Payer: Self-pay | Admitting: Endocrinology

## 2022-09-21 ENCOUNTER — Other Ambulatory Visit: Payer: Self-pay | Admitting: Endocrinology

## 2022-10-11 ENCOUNTER — Other Ambulatory Visit: Payer: Self-pay | Admitting: Endocrinology

## 2022-10-11 DIAGNOSIS — E7800 Pure hypercholesterolemia, unspecified: Secondary | ICD-10-CM

## 2022-10-12 ENCOUNTER — Other Ambulatory Visit: Payer: Self-pay | Admitting: Endocrinology

## 2022-10-12 DIAGNOSIS — E1165 Type 2 diabetes mellitus with hyperglycemia: Secondary | ICD-10-CM

## 2022-10-21 ENCOUNTER — Other Ambulatory Visit: Payer: Self-pay | Admitting: Endocrinology

## 2022-10-21 DIAGNOSIS — Z794 Long term (current) use of insulin: Secondary | ICD-10-CM

## 2022-10-25 ENCOUNTER — Telehealth: Payer: Self-pay

## 2022-10-25 DIAGNOSIS — E1165 Type 2 diabetes mellitus with hyperglycemia: Secondary | ICD-10-CM

## 2022-10-25 MED ORDER — FREESTYLE LIBRE 3 SENSOR MISC: 2 refills | Status: DC

## 2022-10-25 NOTE — Telephone Encounter (Signed)
Wife called in insurance will not cover dexcom but will cover libre. Libre 3 sent to [facility].

## 2022-10-25 NOTE — Telephone Encounter (Signed)
Noted  

## 2022-10-30 ENCOUNTER — Other Ambulatory Visit: Payer: Self-pay

## 2022-10-30 DIAGNOSIS — E1165 Type 2 diabetes mellitus with hyperglycemia: Secondary | ICD-10-CM

## 2022-10-30 MED ORDER — FREESTYLE LIBRE 3 READER DEVI: 1.0000 | Freq: Every day | 0 refills | Status: AC

## 2022-11-01 ENCOUNTER — Other Ambulatory Visit: Payer: Self-pay | Admitting: Endocrinology

## 2022-11-16 ENCOUNTER — Encounter: Payer: Self-pay | Admitting: Endocrinology

## 2022-11-16 ENCOUNTER — Ambulatory Visit (INDEPENDENT_AMBULATORY_CARE_PROVIDER_SITE_OTHER): Payer: No Typology Code available for payment source | Admitting: Endocrinology

## 2022-11-16 VITALS — BP 162/82 | HR 81 | Ht 73.0 in | Wt 247.0 lb

## 2022-11-16 DIAGNOSIS — E1165 Type 2 diabetes mellitus with hyperglycemia: Secondary | ICD-10-CM | POA: Diagnosis not present

## 2022-11-16 DIAGNOSIS — E7800 Pure hypercholesterolemia, unspecified: Secondary | ICD-10-CM | POA: Diagnosis not present

## 2022-11-16 DIAGNOSIS — I10 Essential (primary) hypertension: Secondary | ICD-10-CM | POA: Diagnosis not present

## 2022-11-16 DIAGNOSIS — Z794 Long term (current) use of insulin: Secondary | ICD-10-CM

## 2022-11-16 LAB — COMPREHENSIVE METABOLIC PANEL
ALT: 22 U/L (ref 0–53)
AST: 24 U/L (ref 0–37)
Albumin: 4.7 g/dL (ref 3.5–5.2)
Alkaline Phosphatase: 71 U/L (ref 39–117)
BUN: 19 mg/dL (ref 6–23)
CO2: 27 mEq/L (ref 19–32)
Calcium: 9.5 mg/dL (ref 8.4–10.5)
Chloride: 103 mEq/L (ref 96–112)
Creatinine, Ser: 1.37 mg/dL (ref 0.40–1.50)
GFR: 56.49 mL/min — ABNORMAL LOW (ref >60.00)
Glucose, Bld: 61 mg/dL — ABNORMAL LOW (ref 70–99)
Potassium: 4 mEq/L (ref 3.5–5.1)
Sodium: 137 mEq/L (ref 135–145)
Total Bilirubin: 0.4 mg/dL (ref 0.2–1.2)
Total Protein: 7.8 g/dL (ref 6.0–8.3)

## 2022-11-16 LAB — POCT GLYCOSYLATED HEMOGLOBIN (HGB A1C): Hemoglobin A1C: 6 % — AB (ref 4.0–5.6)

## 2022-11-16 LAB — LIPID PANEL
Cholesterol: 149 mg/dL (ref 0–200)
HDL: 59.8 mg/dL (ref >39.00)
LDL Cholesterol: 72 mg/dL (ref 0–99)
NonHDL: 89.63
Total CHOL/HDL Ratio: 2
Triglycerides: 87 mg/dL (ref 0.0–149.0)
VLDL: 17.4 mg/dL (ref 0.0–40.0)

## 2022-11-16 LAB — MICROALBUMIN / CREATININE URINE RATIO
Creatinine,U: 146.1 mg/dL
Microalb Creat Ratio: 0.5 mg/g (ref 0.0–30.0)
Microalb, Ur: <0.7 mg/dL (ref 0.0–1.9)

## 2022-11-16 MED ORDER — AMLODIPINE BESYLATE 10 MG PO TABS: 10.0000 mg | ORAL_TABLET | Freq: Every day | ORAL | 1 refills | Status: DC

## 2022-11-16 MED ORDER — FIASP 100 UNIT/ML IJ SOLN: INTRAMUSCULAR | 3 refills | Status: DC

## 2022-11-16 MED ORDER — ROSUVASTATIN CALCIUM 5 MG PO TABS: 5.0000 mg | ORAL_TABLET | Freq: Every day | ORAL | 1 refills | Status: DC

## 2022-11-16 MED ORDER — HYDROCHLOROTHIAZIDE 12.5 MG PO CAPS: ORAL_CAPSULE | ORAL | 1 refills | Status: DC

## 2022-11-16 NOTE — Progress Notes (Signed)
Patient ID: Christian Yang, male   DOB: 16-Nov-1960, 62 y.o.   MRN: [id number]           Reason for Appointment: Follow-up for Type 2 Diabetes   History of Present Illness:          Date of diagnosis of type 2 diabetes mellitus:   2008       Background history:   He apparently had a weight of 285 pounds at diagnosis He was having dry mouth and flulike symptoms at onset and glucose was 672 He was started on metformin  He had been on Jardiance for 2 years and Bydureon since 6/17 Records indicated that his A1c has been 10-11+ since at least 2015 Levemir insulin was started 3-4 years prior to his consultation but without adequate control   He was started on the V-go pump in 04/2015  Recent history:   INSULIN regimen is: Insulin pump, V-go 20 basal, mealtime clicks 3-4 at breakfast and 3-4 clicks at  lunch and 2-4 at dinner  Non-insulin hypoglycemic drugs the patient is taking are: metformin 0.5g p.m.  Current management, blood sugar patterns and problems identified: A1c is 6 compared to 6.9   He continues to V-go pump but is not using the freestyle libre instead of Dexcom because of cost He is still not changing the pump consistently at the same time and sometimes in the morning and sometimes at night Overall compared to previous data his blood sugars are are significantly lower Also with 8% of the readings below 70 is getting periodic hypoglycemia mostly during the day This is likely to be from his increased activity and excessive bolusing at meals especially breakfast and lunch when he is more active during the weekday His blood sugars show quite a bit of variability at times with his trying to keep up with his low blood sugar trend while working He has previously not wanted to consider a different pump because of cost Also since the Dexcom is not as well covered he is wanting to continue [place] He has gained weight  Side effects from diabetes medications have been:  None   Sensor description: Dexcom G6  Interpretation of the CGM download as follows  HYPERGLYCEMIA is seen overall infrequently and more in the evenings after about 5 PM Hypoglycemia is seen periodically at all different times especially between about noon and midnight OVERNIGHT blood sugars are somewhat variable in the first part but usually lowest around 5 AM with occasional hypoglycemia at different times Pre-meal blood sugars tend to be the lowest around lunch and dinnertime with significant variability at dinnertime and periodic hypoglycemia which is usually mild and transient POSTPRANDIAL blood sugars after breakfast are only mildly increased initially but usually come down significantly and progressively till lunchtime After lunch blood sugars are mostly lower compared to Premeal readings including some relatively low readings on weekdays and sporadic hypoglycemia After dinner blood sugars are highly variable but generally fairly well-controlled with sporadic hypoglycemia and also hyperglycemia especially regarding for low sugars  CGM use % of time 96  2-week average/GV 117/32  Time in range    86    %  % Time Above 180 6  % Time above 250   % Time Below 70 8     PRE-MEAL Fasting Lunch Dinner 12 PM-2 PM Overall  Glucose range:       Averages: 109  110 99    POST-MEAL PC Breakfast PC Lunch PC Dinner  Glucose range:  Averages: 123 113 131   Previously CGM use % of time 93  2-week average 144+/-49  Time in range 76, was 78       %  % Time Above 180 17+3  % Time above 250   % Time Below 70 3+1     Self-care: The diet that the patient has been following is: tries to limit High-fat foods .     Typical meal intake: Breakfast is cereal, sometimes oatmeal or biscuits, lunch is a sandwich and fruit. Lunch 1 pm Dinner is baked chicken, salad and rice at 7 pm .   Snacks are on Peanut butter and crackers, having this at 10. 30 a.m. at work                Dietician visit,  most recent: At diagnosis only CDE visit: 7/17                 Weight history: Maximum 285  Wt Readings from Last 3 Encounters:  11/16/22 247 lb (112 kg)  07/14/22 240 lb (108.9 kg)  03/10/22 240 lb (108.9 kg)    Glycemic control:   Lab Results  Component Value Date   HGBA1C 6.0 (A) 11/16/2022   HGBA1C 6.9 (A) 07/14/2022   HGBA1C 6.7 (A) 03/10/2022   Lab Results  Component Value Date   MICROALBUR <0.7 11/16/2022   LDLCALC 72 11/16/2022   CREATININE 1.37 11/16/2022   Lab Results  Component Value Date   MICRALBCREAT 0.5 11/16/2022       Allergies as of 11/16/2022       Reactions   Penicillins Rash        Medication List        Accurate as of November 16, 2022  3:42 PM. If you have any questions, ask your nurse or doctor.          amLODipine 10 MG tablet Commonly known as: NORVASC Take 1 tablet (10 mg total) by mouth daily.   Dexcom G6 Transmitter Misc USE AS DIRECTED   Dexcom G7 Sensor Misc CHANGE EVERY 10 DAYS   FreeStyle Libre 3 Sensor Misc Place 1 sensor on the skin every 14 days. Use to check glucose continuously   Fiasp 100 UNIT/ML Soln Generic drug: Insulin Aspart (w/Niacinamide) USE MAX 65 UNITS WITH V-GO INSULIN PUMP DAILY   FreeStyle Libre 3 Reader Devi 1 each by Does not apply route daily.   glucose blood test strip Commonly known as: Contour Next Test 1 each by Other route as needed for other. Check blood sugar before all meals and at HS. Check 2 hours after a meal 5 times per week.   Contour Next Test test strip Generic drug: glucose blood CHECK BLOOD SUGAR 2x daily   hydrochlorothiazide 12.5 MG capsule Commonly known as: MICROZIDE TAKE 1 CAPSULE BY MOUTH DAILY AT DINNER.   metFORMIN 500 MG tablet Commonly known as: GLUCOPHAGE TAKE 1 TABLET BY MOUTH AT BEDTIME APPOINTMENT  REQUIRED  FOR  FUTURE  REFILLS   omeprazole 40 MG capsule Commonly known as: PRILOSEC Take 40 mg by mouth daily.   OneTouch Delica Lancets 33G  Misc Use to check blood sugar 3 times per day.   rosuvastatin 5 MG tablet Commonly known as: CRESTOR Take 1 tablet (5 mg total) by mouth daily.   V-Go 20 20 UNIT/24HR Kit USE AS DIRECTED        Allergies:  Allergies  Allergen Reactions   Penicillins Rash    Past Medical History:  Diagnosis Date   Diabetes mellitus without complication ([facility])    GERD (gastroesophageal reflux disease)    High cholesterol    History of kidney stones     Past Surgical History:  Procedure Laterality Date   CARPAL TUNNEL RELEASE Bilateral    10 +years ago   ESOPHAGOGASTRODUODENOSCOPY  2000   stretching of esophagus   EXTRACORPOREAL SHOCK WAVE LITHOTRIPSY Right 04/08/2018   Procedure: RIGHT EXTRACORPOREAL SHOCK WAVE LITHOTRIPSY (ESWL);  Surgeon: Cleon Gustin, MD;  Location: [facility];  Service: Urology;  Laterality: Right;    Family History  Problem Relation Age of Onset   Heart disease Mother    Diabetes Mother    Diabetes Sister    Diabetes Maternal Grandmother     Social History:  reports that he has never smoked. He has never used smokeless tobacco. He reports that he does not drink alcohol and does not use drugs.   Review of Systems    Lipid history: LDL controlled with Crestor 5 mg    Lab Results  Component Value Date   CHOL 149 11/16/2022   HDL 59.80 11/16/2022   LDLCALC 72 11/16/2022   TRIG 87.0 11/16/2022   CHOLHDL 2 11/16/2022           Hypertension: Is taking amlodipine 10 mg and HCTZ  He has checked at home with a arm cuff but not clear what brand and he thinks his blood pressure is usually in the 130-140+ range including this morning   BP Readings from Last 3 Encounters:  11/16/22 (!) 162/82  07/14/22 (!) 156/78  03/10/22 (!) 170/80    Has mild renal dysfunction without diabetic nephropathy with variable levels Also has history of kidney stone which he thinks he may have passed in 12/21  Lab Results  Component Value Date   CREATININE 1.37  11/16/2022   CREATININE 1.37 07/14/2022   CREATININE 1.40 03/10/2022   Lab Results  Component Value Date   K 4.0 11/16/2022     Most recent eye exam was ?  Most recent foot exam: 9/22   Physical Examination:  BP (!) 162/82   Pulse 81   Ht 6\' 1"  (1.854 m)   Wt 247 lb (112 kg)   SpO2 94%   BMI 32.59 kg/m    ASSESSMENT:  Diabetes type 2, uncontrolled    See history of present illness for detailed discussion of current diabetes management, blood sugar patterns and problems identified  He has had long-standing diabetes, on basal bolus insulin and metformin  A1c is now only 6%  He is on basal bolus insulin with the V-go pump and also low-dose metformin  His glucose is now tending to be more on the low side although he is assessing the readings with the libre instead of Dexcom With his A1c being lower likely is needing less insulin but is only on 20 units basal currently Also requiring relatively low doses of insulin to cover his meals especially when active Still not consistent with the time of changing of his pump  He is not considering going back to multiple insulin injections and wants to continue a pump device  Hypertension: Blood pressure is apparently better at home but higher in the office as before with some whitecoat syndrome He will need to monitor his blood pressure regularly, keep a record and bring his monitor for comparison Also likely can discuss with PCP about nephrology consultation with his borderline renal function    PLAN:    He  will take only 1 click for breakfast and 1-2 clicks for lunch and working Reminded him to change his pump consistently every 24 hours at the same time every day Will check to see if the OmniPod 4 pump is covered, prescription to be sent to the relevant pharmacy Make sure he has a protein with every meal Make sure he is bolusing ahead of time before eating  No change in antihypertensives  Chemistry panel and lipids to  be checked today   Patient Instructions  Breakfast weekdays: 1 click in am 1-2 clicks at lunch  Change at same time  Bring Bp monitor    Reather Littler 11/16/2022, 3:42 PM   Note: This office note was prepared with Dragon voice recognition system technology. Any transcriptional errors that result from this process are unintentional.  Addendum: Creatinine slightly better at 1.37 otherwise normal chemistry

## 2022-11-16 NOTE — Patient Instructions (Addendum)
Breakfast weekdays: 1 click in am 1-2 clicks at lunch  Change at same time  Bring Bp monitor

## 2022-11-17 ENCOUNTER — Encounter: Payer: Self-pay | Admitting: Endocrinology

## 2022-12-14 ENCOUNTER — Other Ambulatory Visit: Payer: Self-pay | Admitting: Family Medicine

## 2022-12-14 DIAGNOSIS — H532 Diplopia: Secondary | ICD-10-CM

## 2022-12-21 ENCOUNTER — Ambulatory Visit (INDEPENDENT_AMBULATORY_CARE_PROVIDER_SITE_OTHER): Payer: No Typology Code available for payment source

## 2022-12-21 DIAGNOSIS — H532 Diplopia: Secondary | ICD-10-CM | POA: Diagnosis not present

## 2022-12-21 DIAGNOSIS — R42 Dizziness and giddiness: Secondary | ICD-10-CM | POA: Diagnosis not present

## 2023-01-16 ENCOUNTER — Other Ambulatory Visit: Payer: Self-pay | Admitting: Endocrinology

## 2023-01-16 DIAGNOSIS — E1165 Type 2 diabetes mellitus with hyperglycemia: Secondary | ICD-10-CM

## 2023-01-29 ENCOUNTER — Telehealth: Payer: Self-pay

## 2023-01-29 DIAGNOSIS — E1165 Type 2 diabetes mellitus with hyperglycemia: Secondary | ICD-10-CM

## 2023-01-29 NOTE — Telephone Encounter (Signed)
Pt advised insurance will no longer cover Fiasp. Covered alternative is Humalog.

## 2023-01-30 MED ORDER — LYUMJEV 100 UNIT/ML IJ SOLN: INTRAMUSCULAR | 1 refills | Status: DC

## 2023-01-30 NOTE — Telephone Encounter (Signed)
Rx sent to pharmacy   

## 2023-02-21 ENCOUNTER — Ambulatory Visit (INDEPENDENT_AMBULATORY_CARE_PROVIDER_SITE_OTHER): Payer: No Typology Code available for payment source | Admitting: Endocrinology

## 2023-02-21 ENCOUNTER — Encounter: Payer: Self-pay | Admitting: Endocrinology

## 2023-02-21 VITALS — BP 140/80 | HR 80 | Ht 73.0 in | Wt 245.0 lb

## 2023-02-21 DIAGNOSIS — E162 Hypoglycemia, unspecified: Secondary | ICD-10-CM | POA: Insufficient documentation

## 2023-02-21 DIAGNOSIS — Z794 Long term (current) use of insulin: Secondary | ICD-10-CM | POA: Diagnosis not present

## 2023-02-21 DIAGNOSIS — N289 Disorder of kidney and ureter, unspecified: Secondary | ICD-10-CM

## 2023-02-21 DIAGNOSIS — E1165 Type 2 diabetes mellitus with hyperglycemia: Secondary | ICD-10-CM

## 2023-02-21 DIAGNOSIS — M19079 Primary osteoarthritis, unspecified ankle and foot: Secondary | ICD-10-CM | POA: Insufficient documentation

## 2023-02-21 DIAGNOSIS — E7800 Pure hypercholesterolemia, unspecified: Secondary | ICD-10-CM | POA: Diagnosis not present

## 2023-02-21 DIAGNOSIS — E119 Type 2 diabetes mellitus without complications: Secondary | ICD-10-CM | POA: Insufficient documentation

## 2023-02-21 LAB — BASIC METABOLIC PANEL
BUN: 20 mg/dL (ref 6–23)
CO2: 25 mEq/L (ref 19–32)
Calcium: 9.4 mg/dL (ref 8.4–10.5)
Chloride: 103 mEq/L (ref 96–112)
Creatinine, Ser: 1.44 mg/dL (ref 0.40–1.50)
GFR: 53.11 mL/min — ABNORMAL LOW (ref >60.00)
Glucose, Bld: 124 mg/dL — ABNORMAL HIGH (ref 70–99)
Potassium: 4.6 mEq/L (ref 3.5–5.1)
Sodium: 137 mEq/L (ref 135–145)

## 2023-02-21 LAB — POCT GLYCOSYLATED HEMOGLOBIN (HGB A1C): Hemoglobin A1C: 5.9 % — AB (ref 4.0–5.6)

## 2023-02-21 NOTE — Patient Instructions (Signed)
2 clicks at supper except with hi starch meal  Stop Metformin

## 2023-02-21 NOTE — Progress Notes (Signed)
Patient ID: Christian Yang, male   DOB: 1961/02/22, 62 y.o.   MRN: [id number]           Reason for Appointment: Follow-up for Type 2 Diabetes   History of Present Illness:          Date of diagnosis of type 2 diabetes mellitus:   2008       Background history:   He apparently had a weight of 285 pounds at diagnosis He was having dry mouth and flulike symptoms at onset and glucose was 672 He was started on metformin  He had been on Jardiance for 2 years and Bydureon since 6/17 Records indicated that his A1c has been 10-11+ since at least 2015 Levemir insulin was started 3-4 years prior to his consultation but without adequate control   He was started on the V-go pump in 04/2015  Recent history:   INSULIN regimen is: Insulin pump, V-go 20 basal, mealtime clicks 3-4 at breakfast and 3-4 clicks at  lunch and 2-4 at dinner  Non-insulin hypoglycemic drugs the patient is taking are: metformin 0.5g p.m.  Current insulin formulation: Lyumjev  Current management, blood sugar patterns and problems identified:  A1c is about the same at 5.9  He continues to V-go pump, not clear why he did not pick up the OmniPod pump from the pharmacy on the last visit, usually concerned about the cost; also cannot afford Dexcom  Also his blood sugars are similar to his last visit he is still getting 7% of readings below 70  Most of his low sugars appear to be late at night when he is taking 3 clicks for his dinner meal If he is eating a lower carbohydrate meal with more protein he will tend to get low late at night Otherwise if he is eating higher carbohydrate meals such as pasta or macaroni his blood sugars will be significantly above 180 Also sporadically will have high readings after breakfast and lunch especially on weekends Sometimes late with giving his clicks at mealtimes and this may cause late hypoglycemia Weight has improved slightly He will not take his metformin regularly and only when his  blood sugars are high  Dinner 6 pm, breakfast usually 6 am  Side effects from diabetes medications have been: Possibly rash with Jardiance   Sensor description: Dexcom G6  Interpretation of the CGM download for the last 2 weeks through 5/1 as follows  HYPERGLYCEMIc episodes are occurring inconsistently after meals especially between April 19 and 21 but usually only minimal episodes of hypoglycemia postprandially Hypoglycemia is occurring most frequently around 9 PM-1 AM but also periodically around 3 AM or early afternoon, usually transient  OVERNIGHT blood sugars are generally low normal starting around 11 PM through 4 AM with occasional hypoglycemia  Pre-meal blood sugars averaging as below POSTPRANDIAL blood sugars after breakfast are very well-controlled with only sporadic hyperglycemia Blood sugars after lunch are generally even compared to Preme[place] readings with only 1 episode of hyperglycemia Blood sugars at dinnertime and usually are level compared to [place] readings with about 2 episodes of hyperglycemia but usually start trending down lower after 9 PM through midnight  CGM use % of time   2-week average/GV 118/32  Time in range      86  %  % Time Above 180 7  % Time above 250   % Time Below 70 7     PRE-MEAL Fasting Lunch Dinner 12 AM-2 AM Overall  Glucose range:  Averages: 110   99    POST-MEAL PC Breakfast PC Lunch PC Dinner  Glucose range:     Averages: 127 131 130   Previously  CGM use % of time 96  2-week average/GV 117/32  Time in range    86    %  % Time Above 180 6  % Time above 250   % Time Below 70 8     PRE-MEAL Fasting Lunch Dinner 12 PM-2 PM Overall  Glucose range:       Averages: 109  110 99    POST-MEAL PC Breakfast PC Lunch PC Dinner  Glucose range:     Averages: 123 113 131    Self-care: The diet that the patient has been following is: tries to limit High-fat foods .     Typical meal intake: Breakfast is cereal, sometimes oatmeal  or biscuits, lunch is a sandwich and fruit. Lunch 1 pm Dinner is baked chicken, salad and rice at 7 pm .   Snacks are on Peanut butter and crackers, having this at 10. 30 a.m. at work                Dietician visit, most recent: At diagnosis only CDE visit: 7/17                 Weight history: Maximum 285  Wt Readings from Last 3 Encounters:  02/21/23 245 lb (111.1 kg)  11/16/22 247 lb (112 kg)  07/14/22 240 lb (108.9 kg)    Glycemic control:   Lab Results  Component Value Date   HGBA1C 5.9 (A) 02/21/2023   HGBA1C 6.0 (A) 11/16/2022   HGBA1C 6.9 (A) 07/14/2022   Lab Results  Component Value Date   MICROALBUR <0.7 11/16/2022   LDLCALC 72 11/16/2022   CREATININE 1.37 11/16/2022   Lab Results  Component Value Date   MICRALBCREAT 0.5 11/16/2022       Allergies as of 02/21/2023       Reactions   Penicillins Rash        Medication List        Accurate as of Feb 21, 2023  9:30 AM. If you have any questions, ask your nurse or doctor.          STOP taking these medications    Dexcom G6 Transmitter Misc Stopped by: Reather Littler, MD   Fiasp 100 UNIT/ML Soln Generic drug: Insulin Aspart (w/Niacinamide) Stopped by: Reather Littler, MD       TAKE these medications    amLODipine 10 MG tablet Commonly known as: NORVASC Take 1 tablet (10 mg total) by mouth daily.   FreeStyle Libre 3 Reader [place] 1 each by Does not apply route daily.   FreeStyle Libre 3 Sensor Misc Place 1 sensor on the skin every 14 days. Use to check glucose continuously What changed: Another medication with the same name was removed. Continue taking this medication, and follow the directions you see here. Changed by: Reather Littler, MD   glucose blood test strip Commonly known as: Contour Next Test 1 each by Other route as needed for other. Check blood sugar before all meals and at HS. Check 2 hours after a meal 5 times per week.   Contour Next Test test strip Generic drug: glucose  blood CHECK BLOOD SUGAR 2x daily   hydrochlorothiazide 12.5 MG capsule Commonly known as: MICROZIDE TAKE 1 CAPSULE BY MOUTH DAILY AT DINNER.   Lyumjev 100 UNIT/ML Soln Generic drug: Insulin Lispro-aabc USE MAX  65 UNITS WITH V-GO INSULIN PUMP DAILY   meclizine 25 MG tablet Commonly known as: ANTIVERT Take 25 mg by mouth every 8 (eight) hours as needed for dizziness.   metFORMIN 500 MG tablet Commonly known as: GLUCOPHAGE TAKE 1 TABLET BY MOUTH AT BEDTIME APPOINTMENT  REQUIRED  FOR  FUTURE  REFILLS   omeprazole 40 MG capsule Commonly known as: PRILOSEC Take 40 mg by mouth daily.   OneTouch Delica Lancets 33G Misc Use to check blood sugar 3 times per day.   rosuvastatin 5 MG tablet Commonly known as: CRESTOR Take 1 tablet (5 mg total) by mouth daily.   terazosin 2 MG capsule Commonly known as: HYTRIN Take 2 mg by mouth at bedtime.   V-Go 20 20 UNIT/24HR Kit USE AS DIRECTED        Allergies:  Allergies  Allergen Reactions   Penicillins Rash    Past Medical History:  Diagnosis Date   Diabetes mellitus without complication (HCC)    GERD (gastroesophageal reflux disease)    High cholesterol    History of kidney stones     Past Surgical History:  Procedure Laterality Date   CARPAL TUNNEL RELEASE Bilateral    10 +years ago   ESOPHAGOGASTRODUODENOSCOPY  2000   stretching of esophagus   EXTRACORPOREAL SHOCK WAVE LITHOTRIPSY Right 04/08/2018   Procedure: RIGHT EXTRACORPOREAL SHOCK WAVE LITHOTRIPSY (ESWL);  Surgeon: [name], MD;  Location: [facility];  Service: Urology;  Laterality: Right;    Family History  Problem Relation Age of Onset   Heart disease Mother    Diabetes Mother    Diabetes Sister    Diabetes Maternal Grandmother     Social History:  reports that he has never smoked. He has never used smokeless tobacco. He reports that he does not drink alcohol and does not use drugs.   Review of Systems    Lipid history: LDL controlled with  Crestor 5 mg    Lab Results  Component Value Date   CHOL 149 11/16/2022   HDL 59.80 11/16/2022   LDLCALC 72 11/16/2022   TRIG 87.0 11/16/2022   CHOLHDL 2 11/16/2022           Hypertension: Is taking amlodipine 10 mg and HCTZ His PCP has added Hytrin 2mg  He thinks his blood pressure is better with this  Home blood pressure range 128-144 systolic  He has taken a record of his blood pressure readings to his PCP   BP Readings from Last 3 Encounters:  02/21/23 (!) 140/80  11/16/22 (!) 162/82  07/14/22 (!) 156/78    Has mild renal dysfunction without diabetic nephropathy with variable levels Also has history of kidney stone which he thinks he may have passed in 12/21  Lab Results  Component Value Date   CREATININE 1.37 11/16/2022   CREATININE 1.37 07/14/2022   CREATININE 1.40 03/10/2022   Lab Results  Component Value Date   K 4.0 11/16/2022    Most recent foot exam: 02/2023   Physical Examination:  BP (!) 140/80 (Patient Position: Sitting)   Pulse 80   Ht 6\' 1"  (1.854 m)   Wt 245 lb (111.1 kg)   SpO2 96%   BMI 32.32 kg/m   Diabetic Foot Exam - Simple   Simple Foot Form Diabetic Foot exam was performed with the following findings: Yes   Visual Inspection No deformities, no ulcerations, no other skin breakdown bilaterally: Yes See comments: Yes Sensation Testing Intact to touch and monofilament testing bilaterally: Yes Pulse  Check Posterior Tibialis and Dorsalis pulse intact bilaterally: Yes Comments 2+ left ankle edema and 1+ right ankle edema     ASSESSMENT:  Diabetes type 2, uncontrolled    See history of present illness for detailed discussion of current diabetes management, blood sugar patterns and problems identified  He has had long-standing diabetes, on basal bolus insulin and metformin  A1c is now only 5.9 %  He is on basal bolus insulin with the V-go pump and also low-dose metformin  His libre indicates his blood sugars to be within the  target range 86% of the time but he still has excessively low readings, now 7% below 70 He is still reluctant to consider a different type of pump because of cost and cannot afford the Dexcom His blood sugars may be higher with certain meals but also not reducing his boluses enough for lower carbohydrate meals at times especially dinnertime  Hypertension: Blood pressure is usually better at home but higher in the office  However appears to be a little better with adding Hytrin  CKD: This is mild but may consider adding Farxiga if creatinine is worse   PLAN:    He will stop metformin Take only 2 clicks instead of 3 at dinnertime unless eating more carbohydrates, may sometimes need for clicks Also adjust boluses at breakfast and lunch based on type of meal Midmorning snack to avoid low sugars before lunch while working Changed the pump consistently at the same time daily Make sure he has a protein with every meal especially breakfast and dinner Make sure he is bolusing 5 to 10 minutes before eating  Continue same blood pressure medications  Chemistry panel to be checked today   Patient Instructions  2 clicks at supper except with hi starch meal  Stop Metformin    Reather Littler 02/21/2023, 9:30 AM   Note: This office note was prepared with Insurance underwriter. Any transcriptional errors that result from this process are unintentional.

## 2023-02-23 ENCOUNTER — Encounter: Payer: Self-pay | Admitting: Endocrinology

## 2023-03-06 ENCOUNTER — Other Ambulatory Visit: Payer: Self-pay | Admitting: Endocrinology

## 2023-05-16 ENCOUNTER — Other Ambulatory Visit: Payer: Self-pay | Admitting: Endocrinology

## 2023-05-24 NOTE — Progress Notes (Unsigned)
Patient ID: Christian Yang, male   DOB: 01-29-1961, 62 y.o.   MRN: [id number]           Reason for Appointment: Follow-up for Type 2 Diabetes   History of Present Illness:          Date of diagnosis of type 2 diabetes mellitus:   2008       Background history:   He apparently had a weight of 285 pounds at diagnosis He was having dry mouth and flulike symptoms at onset and glucose was 672 He was started on metformin  He had been on Jardiance for 2 years and Bydureon since 6/17 Records indicated that his A1c has been 10-11+ since at least 2015 Levemir insulin was started 3-4 years prior to his consultation but without adequate control   He was started on the V-go pump in 04/2015  Recent history:   INSULIN regimen is: Insulin pump, V-go 20 basal, mealtime clicks 3-4 at breakfast and 3-4 clicks at  lunch and 2-4 at dinner  Non-insulin hypoglycemic drugs the patient is taking are: metformin 0.5g p.rn.  Current insulin formulation: Lyumjev  Current management, blood sugar patterns and problems identified:  A1c is about the same at 6.1  He continues to V-go pump, not wanting to switch to OmniPod with the cost More affordable for him to continue using the libre 3  Although his overall blood sugars are well-controlled he is having frequent hypoglycemia during the day mostly on weekdays This is from his relatively active work style He does have slightly higher readings after breakfast but subsequently blood sugars are periodically getting low and he has to take snacks to counteract this regularly  Usually with an average of 3 clicks at dinnertime his blood sugars after dinner are well-controlled Also compared to last visit has not had any overnight hypoglycemia This is from stopping metformin However he says he takes metformin sometimes when his blood sugars are high  Blood glucose is controlled with small number of bolus clicks both at breakfast and dinner He says he is getting some  protein at breakfast  Dinner 6 pm, breakfast usually 6 am  Side effects from diabetes medications have been: Possibly rash with Jardiance   Sensor description: Libre 3  Interpretation of the CGM download for the last 2 weeks through 8/2 as follows  HYPERGLYCEMIc episodes are occurring periodically at all different times with no consistent pattern with highest blood sugars after breakfast Overnight blood sugars vary significantly and only trending higher early morning with hyperglycemia; also has periodically high readings above 200 but not frequently Average blood sugars are not rising significantly after any of his meals As above he will periodically have blood sugar spikes sporadically after meals but mostly after dinner or late in the evening HYPOGLYCEMIA is occurring fairly frequently during the day at all different times between 8 AM and 7 PM his blood sugars overall are 8 AM-10 AM  Statistics: CGM use % of time   2-week average/GV 123/32  Time in range       85%  % Time Above 180 9  % Time above 250   % Time Below 70 6     PRE-MEAL Fasting Lunch Dinner Bedtime Overall  Glucose range:       Averages: 133 111 116     POST-MEAL PC Breakfast PC Lunch PC Dinner  Glucose range:     Averages: 139 113 136     CGM use % of time  2-week average/GV 118/32  Time in range      86  %  % Time Above 180 7  % Time above 250   % Time Below 70 7     PRE-MEAL Fasting Lunch Dinner 12 AM-2 AM Overall  Glucose range:       Averages: 110   99    POST-MEAL PC Breakfast PC Lunch PC Dinner  Glucose range:     Averages: 127 131 130   Self-care: The diet that the patient has been following is: tries to limit High-fat foods .     Typical meal intake: Breakfast is cereal, sometimes oatmeal or biscuits, lunch is a sandwich and fruit. Lunch 1 pm Dinner is baked chicken, salad and rice at 7 pm .   Snacks are on Peanut butter and crackers, having this at 10. 30 a.m. at work                 Dietician visit, most recent: At diagnosis only CDE visit: 7/17                 Weight history: Maximum 285  Wt Readings from Last 3 Encounters:  05/25/23 235 lb (106.6 kg)  02/21/23 245 lb (111.1 kg)  11/16/22 247 lb (112 kg)    Glycemic control:   Lab Results  Component Value Date   HGBA1C 6.1 (A) 05/25/2023   HGBA1C 5.9 (A) 02/21/2023   HGBA1C 6.0 (A) 11/16/2022   Lab Results  Component Value Date   MICROALBUR <0.7 11/16/2022   LDLCALC 72 11/16/2022   CREATININE 1.44 02/21/2023   Lab Results  Component Value Date   MICRALBCREAT 0.5 11/16/2022       Allergies as of 05/25/2023       Reactions   Penicillins Rash        Medication List        Accurate as of May 25, 2023  8:40 AM. If you have any questions, ask your nurse or doctor.          amLODipine 10 MG tablet Commonly known as: NORVASC Take 1 tablet (10 mg total) by mouth daily.   FreeStyle Libre 3 Reader [place] 1 each by Does not apply route daily.   FreeStyle Libre 3 Sensor Misc Place 1 sensor on the skin every 14 days. Use to check glucose continuously   glucose blood test strip Commonly known as: Contour Next Test 1 each by Other route as needed for other. Check blood sugar before all meals and at HS. Check 2 hours after a meal 5 times per week.   Contour Next Test test strip Generic drug: glucose blood CHECK BLOOD SUGAR 2x daily   hydrochlorothiazide 12.5 MG capsule Commonly known as: MICROZIDE TAKE 1 CAPSULE BY MOUTH DAILY AT DINNER.   Lyumjev 100 UNIT/ML Soln Generic drug: Insulin Lispro-aabc USE MAX 65 UNITS WITH V-GO INSULIN PUMP DAILY   meclizine 25 MG tablet Commonly known as: ANTIVERT Take 25 mg by mouth every 8 (eight) hours as needed for dizziness.   metFORMIN 500 MG tablet Commonly known as: GLUCOPHAGE TAKE 1 TABLET BY MOUTH AT BEDTIME APPOINTMENT  REQUIRED  FOR  FUTURE  REFILLS   omeprazole 40 MG capsule Commonly known as: PRILOSEC Take 40 mg by mouth daily.    OneTouch Delica Lancets 33G Misc Use to check blood sugar 3 times per day.   rosuvastatin 5 MG tablet Commonly known as: CRESTOR Take 1 tablet (5 mg total) by mouth daily.  terazosin 2 MG capsule Commonly known as: HYTRIN Take 2 mg by mouth at bedtime.   V-Go 20 20 UNIT/24HR Kit USE AS DIRECTED        Allergies:  Allergies  Allergen Reactions   Penicillins Rash    Past Medical History:  Diagnosis Date   Diabetes mellitus without complication (HCC)    GERD (gastroesophageal reflux disease)    High cholesterol    History of kidney stones     Past Surgical History:  Procedure Laterality Date   CARPAL TUNNEL RELEASE Bilateral    10 +years ago   ESOPHAGOGASTRODUODENOSCOPY  2000   stretching of esophagus   EXTRACORPOREAL SHOCK WAVE LITHOTRIPSY Right 04/08/2018   Procedure: RIGHT EXTRACORPOREAL SHOCK WAVE LITHOTRIPSY (ESWL);  Surgeon: [name], MD;  Location: [facility];  Service: Urology;  Laterality: Right;    Family History  Problem Relation Age of Onset   Heart disease Mother    Diabetes Mother    Diabetes Sister    Diabetes Maternal Grandmother     Social History:  reports that he has never smoked. He has never used smokeless tobacco. He reports that he does not drink alcohol and does not use drugs.   Review of Systems    Lipid history: LDL controlled with Crestor 5 mg    Lab Results  Component Value Date   CHOL 149 11/16/2022   HDL 59.80 11/16/2022   LDLCALC 72 11/16/2022   TRIG 87.0 11/16/2022   CHOLHDL 2 11/16/2022           Hypertension: Is taking amlodipine 10 mg and HCTZ His PCP has added Hytrin 2mg  Since recently has been taking HCTZ again and tolerating it better now, this may be helping his control Home blood pressure range 128-140 systolic    BP Readings from Last 3 Encounters:  05/25/23 138/80  02/21/23 (!) 140/80  11/16/22 (!) 162/82    Has mild renal dysfunction without diabetic nephropathy with variable  levels Also has history of kidney stone which he thinks he may have passed again in 7/24  Lab Results  Component Value Date   CREATININE 1.44 02/21/2023   CREATININE 1.37 11/16/2022   CREATININE 1.37 07/14/2022   Lab Results  Component Value Date   K 4.6 02/21/2023    Most recent foot exam: 02/2023   Physical Examination:  BP 138/80   Pulse 85   Ht 6\' 1"  (1.854 m)   Wt 235 lb (106.6 kg)   SpO2 95%   BMI 31.00 kg/m    ASSESSMENT:  Diabetes type 2, uncontrolled    See history of present illness for detailed discussion of current diabetes management, blood sugar patterns and problems identified  He has had long-standing diabetes, on basal bolus insulin and metformin  A1c is now 6.1 compared to 5.9 %  He is on basal bolus insulin with the V-go pump   His overall level of control is excellent but he has frequent hypoglycemia during the day This is related to his activity level at work especially As before he does not want to switch to a closed-loop insulin pump because of cost Currently 6% of the readings are below 70, similar to previous visit but less often overnight with stopping metformin Usually able to control postprandial readings well with Josephine Igo indicates his blood sugars to be within the target range 86% of the time but he still has excessively low readings, now 7% below 70 He is still reluctant to consider a different type of  pump because of cost and cannot afford the Dexcom His blood sugars may be higher with certain meals but also not reducing his boluses enough for lower carbohydrate meals at times especially dinnertime  Hypertension: Blood pressure is usually better at home but relatively better now with adding HCTZ  CKD: This is mild and likely related to a combination of diabetes and hypertension   PLAN:    As a trial he will stop his insulin pump when he leaves for work and start back when he comes back home to see if this will prevent any  hypoglycemia in while he is active In the meantime he can take an insulin syringe with him to do mealtime shot at lunchtime, likely only 3 to 4 units and only if he sees that his blood sugars are rising excessively after lunch For breakfast and dinner continue bolusing 5 to 10 minutes before eating with the same doses   History of renal dysfunction Discussed that if his renal function is trending lower will need to consider adding Marcelline Deist If this is done he will stop HCTZ  Continue same blood pressure medications  Chemistry panel to be checked today Also will need to have lipid panel checked and follow-up his cholesterol treatment with Crestor   There are no Patient Instructions on file for this visit.    Reather Littler 05/25/2023, 8:40 AM   Note: This office note was prepared with Dragon voice recognition system technology. Any transcriptional errors that result from this process are unintentional.  Addendum all labs normal, do not need to start Farxiga at this time

## 2023-05-25 ENCOUNTER — Ambulatory Visit (INDEPENDENT_AMBULATORY_CARE_PROVIDER_SITE_OTHER): Payer: No Typology Code available for payment source | Admitting: Endocrinology

## 2023-05-25 ENCOUNTER — Encounter: Payer: Self-pay | Admitting: Endocrinology

## 2023-05-25 VITALS — BP 138/80 | HR 85 | Ht 73.0 in | Wt 235.0 lb

## 2023-05-25 DIAGNOSIS — E7800 Pure hypercholesterolemia, unspecified: Secondary | ICD-10-CM

## 2023-05-25 DIAGNOSIS — N289 Disorder of kidney and ureter, unspecified: Secondary | ICD-10-CM

## 2023-05-25 DIAGNOSIS — Z794 Long term (current) use of insulin: Secondary | ICD-10-CM

## 2023-05-25 DIAGNOSIS — I10 Essential (primary) hypertension: Secondary | ICD-10-CM

## 2023-05-25 DIAGNOSIS — E1165 Type 2 diabetes mellitus with hyperglycemia: Secondary | ICD-10-CM | POA: Diagnosis not present

## 2023-05-25 LAB — COMPREHENSIVE METABOLIC PANEL
ALT: 19 U/L (ref 0–53)
AST: 23 U/L (ref 0–37)
Albumin: 4.4 g/dL (ref 3.5–5.2)
Alkaline Phosphatase: 68 U/L (ref 39–117)
BUN: 21 mg/dL (ref 6–23)
CO2: 29 mEq/L (ref 19–32)
Calcium: 9.4 mg/dL (ref 8.4–10.5)
Chloride: 102 mEq/L (ref 96–112)
Creatinine, Ser: 1.41 mg/dL (ref 0.40–1.50)
GFR: 54.37 mL/min — ABNORMAL LOW (ref >60.00)
Glucose, Bld: 107 mg/dL — ABNORMAL HIGH (ref 70–99)
Potassium: 4 mEq/L (ref 3.5–5.1)
Sodium: 137 mEq/L (ref 135–145)
Total Bilirubin: 0.4 mg/dL (ref 0.2–1.2)
Total Protein: 7.2 g/dL (ref 6.0–8.3)

## 2023-05-25 LAB — LIPID PANEL
Cholesterol: 135 mg/dL (ref 0–200)
HDL: 60.3 mg/dL (ref >39.00)
LDL Cholesterol: 60 mg/dL (ref 0–99)
NonHDL: 74.23
Total CHOL/HDL Ratio: 2
Triglycerides: 72 mg/dL (ref 0.0–149.0)
VLDL: 14.4 mg/dL (ref 0.0–40.0)

## 2023-05-25 LAB — POCT GLYCOSYLATED HEMOGLOBIN (HGB A1C): Hemoglobin A1C: 6.1 % — AB (ref 4.0–5.6)

## 2023-05-25 NOTE — Patient Instructions (Signed)
Leave off pump at work

## 2023-05-28 ENCOUNTER — Encounter: Payer: Self-pay | Admitting: Endocrinology

## 2023-05-29 ENCOUNTER — Encounter: Payer: Self-pay | Admitting: Pain Medicine

## 2023-05-30 ENCOUNTER — Telehealth: Payer: Self-pay

## 2023-05-30 NOTE — Telephone Encounter (Signed)
-----   Message from Reather Littler sent at 05/27/2023  6:37 PM EDT ----- Kidney function is stable, will hold off starting [place]. Cholesterol okay, continue rosuvastatin

## 2023-05-30 NOTE — Telephone Encounter (Signed)
I left a message for the patient to return my call.

## 2023-07-09 ENCOUNTER — Other Ambulatory Visit: Payer: Self-pay

## 2023-07-09 DIAGNOSIS — E1165 Type 2 diabetes mellitus with hyperglycemia: Secondary | ICD-10-CM

## 2023-07-09 MED ORDER — FREESTYLE LIBRE 3 SENSOR MISC
2 refills | Status: DC
Start: 2023-07-09 — End: 2024-03-21

## 2023-07-09 MED ORDER — LYUMJEV 100 UNIT/ML IJ SOLN
INTRAMUSCULAR | 1 refills | Status: DC
Start: 2023-07-09 — End: 2023-12-20

## 2023-07-16 ENCOUNTER — Telehealth: Payer: Self-pay | Admitting: Endocrinology

## 2023-07-16 NOTE — Telephone Encounter (Signed)
MEDICATION:   PHARMACY:    HAS THE PATIENT CONTACTED THEIR PHARMACY?    IS THIS A 90 DAY SUPPLY :   IS PATIENT OUT OF MEDICATION:   IF NOT; HOW MUCH IS LEFT:   LAST APPOINTMENT DATE: @9 /16/2024  NEXT APPOINTMENT DATE:@12 /09/2023  DO WE HAVE YOUR PERMISSION TO LEAVE A DETAILED MESSAGE?:  OTHER COMMENTS:    **Let patient know to contact pharmacy at the end of the day to make sure medication is ready. **  ** Please notify patient to allow 48-72 hours to process**  **Encourage patient to contact the pharmacy for refills or they can request refills through [facility]**

## 2023-07-16 NOTE — Telephone Encounter (Signed)
error 

## 2023-07-17 ENCOUNTER — Other Ambulatory Visit: Payer: Self-pay

## 2023-07-17 DIAGNOSIS — E7800 Pure hypercholesterolemia, unspecified: Secondary | ICD-10-CM

## 2023-07-17 MED ORDER — ROSUVASTATIN CALCIUM 5 MG PO TABS
5.0000 mg | ORAL_TABLET | Freq: Every day | ORAL | 1 refills | Status: DC
Start: 2023-07-17 — End: 2024-01-14

## 2023-08-03 ENCOUNTER — Other Ambulatory Visit: Payer: Self-pay

## 2023-08-03 DIAGNOSIS — E1165 Type 2 diabetes mellitus with hyperglycemia: Secondary | ICD-10-CM

## 2023-08-03 MED ORDER — V-GO 20 20 UNIT/24HR KIT: 20.0000 [IU] | PACK | 2 refills | Status: DC

## 2023-10-04 ENCOUNTER — Ambulatory Visit: Payer: No Typology Code available for payment source | Admitting: Endocrinology

## 2023-10-04 ENCOUNTER — Encounter: Payer: Self-pay | Admitting: Endocrinology

## 2023-10-04 VITALS — BP 190/90

## 2023-10-04 DIAGNOSIS — E162 Hypoglycemia, unspecified: Secondary | ICD-10-CM | POA: Diagnosis not present

## 2023-10-04 DIAGNOSIS — Z794 Long term (current) use of insulin: Secondary | ICD-10-CM | POA: Diagnosis not present

## 2023-10-04 DIAGNOSIS — E118 Type 2 diabetes mellitus with unspecified complications: Secondary | ICD-10-CM | POA: Diagnosis not present

## 2023-10-04 LAB — POCT GLYCOSYLATED HEMOGLOBIN (HGB A1C): Hemoglobin A1C: 6.8 % — AB (ref 4.0–5.6)

## 2023-10-04 MED ORDER — GVOKE HYPOPEN 1-PACK 1 MG/0.2ML ~~LOC~~ SOAJ: 1.0000 mg | SUBCUTANEOUS | 2 refills | Status: AC | PRN

## 2023-10-04 NOTE — Patient Instructions (Signed)
Meal bolus : 0-1 click at work time.  Adjust meal time insulin based on meal size and carbohydrate content and physical activity. Not more than 2 clicks at a time.

## 2023-10-04 NOTE — Progress Notes (Signed)
Outpatient Endocrinology Note Christian Makalya Nave, MD  10/04/23  Patient's Name: Christian Yang    DOB: 1960/10/26    MRN: [id number]                                                    REASON OF VISIT: Follow up for type 2 diabetes mellitus  PCP: [name], MD  HISTORY OF PRESENT ILLNESS:   Christian Yang is a 62 y.o. old male with past medical history listed below, is here for follow up for type 2 diabetes mellitus.   Pertinent Diabetes History: Patient was previously seen by Dr. [name] and was last time seen in August 2024.  Patient was diagnosed with type 2 diabetes mellitus in 2008.  He has been on V-Go pump since July 2016.  Chronic Diabetes Complications : Retinopathy: no. Last ophthalmology exam was done on annually, following with ophthalmology regularly.  Nephropathy: CKD IIIa Peripheral neuropathy: no Coronary artery disease: no Stroke: no  Relevant comorbidities and cardiovascular risk factors: Obesity: no There is no height or weight on file to calculate BMI.  Hypertension: Yes  Hyperlipidemia : Yes, on statin   Current / Home Diabetic regimen includes:  INSULIN regimen is: Insulin pump, V-go 20 basal, mealtime clicks 2-3 at breakfast and 2-3clicks at  lunch and 2-3 at dinner   Non-insulin hypoglycemic drugs the patient is taking are: metformin 0.5g as needed.   Current insulin formulation: Lyumjev  He was advised to switch to OmniPod in the past unwilling due to concern of the cost.  Prior diabetic medications: [place] Pepper, Bydureon in the past around 2017.  He had taken Levemir insulin in the past.  Possible rash from [place].  Glycemic data:    CONTINUOUS GLUCOSE MONITORING SYSTEM (CGMS) INTERPRETATION: At today's visit, we reviewed CGM downloads. The full report is scanned in the media. Reviewing the CGM trends, blood glucose are as follows:  FreeStyle Libre 3 CGM-  Sensor Download (Sensor download was reviewed and summarized below.) Dates: November 29  to October 04, 2023, 14 days Sensor Average: 124  Glucose Management Indicator: 6.3% Glucose Variability: 38.1%  % data captured: 96%  Glycemic Trends:  <54: 3% 54-70: 7% 71-180: 77% 181-250: 13% 251-400: 0%   Interpretation: -Intermittent hypoglycemia with blood sugar mostly in the 60s range especially in the afternoon related with work/increased physical activity.  Sometimes hypoglycemia overnight and early morning.  Mild hyperglycemia with blood sugar up to 200 range mostly related with correction for hypoglycemia and also related with meals.  Blood sugar on weekend nonwork days with less hypoglycemia compared to work days.  Hypoglycemia: Patient has recurrent hypoglycemic episodes. Patient has hypoglycemia awareness.  Factors modifying glucose control: 1.  Diabetic diet assessment: 3 meals a day.  2.  Staying active or exercising: Active at work.  3.  Medication compliance: compliant all of the time.  Interval history  He has not been generally taking metformin.  He has frequent/intermittent hypoglycemia as noted above.  There has been 10% hypoglycemia in the last 2 weeks on CGM review.  He is very active at work physically involved job.  He feels hypoglycemic symptoms and usually corrected with glucose tablet.  He feels hypoglycemic symptoms when blood sugar is around 50 range.  No other complaints today.  Hemoglobin A1c 6.8%.  GMI on CGM 6.3%.  REVIEW  OF SYSTEMS As per history of present illness.   PAST MEDICAL HISTORY: Past Medical History:  Diagnosis Date   Diabetes mellitus without complication (HCC)    GERD (gastroesophageal reflux disease)    High cholesterol    History of kidney stones     PAST SURGICAL HISTORY: Past Surgical History:  Procedure Laterality Date   CARPAL TUNNEL RELEASE Bilateral    10 +years ago   ESOPHAGOGASTRODUODENOSCOPY  2000   stretching of esophagus   EXTRACORPOREAL SHOCK WAVE LITHOTRIPSY Right 04/08/2018   Procedure: RIGHT  EXTRACORPOREAL SHOCK WAVE LITHOTRIPSY (ESWL);  Surgeon: [name], MD;  Location: [facility];  Service: Urology;  Laterality: Right;    ALLERGIES: Allergies  Allergen Reactions   Penicillins Rash    FAMILY HISTORY:  Family History  Problem Relation Age of Onset   Heart disease Mother    Diabetes Mother    Diabetes Sister    Diabetes Maternal Grandmother     SOCIAL HISTORY: Social History   Socioeconomic History   Marital status: Married    Spouse name: Not on file   Number of children: Not on file   Years of education: Not on file   Highest education level: Not on file  Occupational History   Not on file  Tobacco Use   Smoking status: Never   Smokeless tobacco: Never  Substance and Sexual Activity   Alcohol use: No    Alcohol/week: 0.0 standard drinks of alcohol   Drug use: Never   Sexual activity: Not on file  Other Topics Concern   Not on file  Social History Narrative   Not on file   Social Drivers of Health   Financial Resource Strain: Not on file  Food Insecurity: Not on file  Transportation Needs: Not on file  Physical Activity: Not on file  Stress: Not on file  Social Connections: Not on file    MEDICATIONS:  Current Outpatient Medications  Medication Sig Dispense Refill   Glucagon (GVOKE HYPOPEN 1-PACK) 1 MG/0.2ML SOAJ Inject 1 mg into the skin as needed (low blood sugar with impaired consciousness). 0.4 mL 2   amLODipine (NORVASC) 10 MG tablet Take 1 tablet (10 mg total) by mouth daily. 90 tablet 1   Continuous Blood Gluc Receiver (FREESTYLE LIBRE 3 READER) DEVI 1 each by Does not apply route daily. 1 each 0   Continuous Glucose Sensor (FREESTYLE LIBRE 3 SENSOR) MISC Place 1 sensor on the skin every 14 days. Use to check glucose continuously 6 each 2   glucose blood (CONTOUR NEXT TEST) test strip 1 each by Other route as needed for other. Check blood sugar before all meals and at HS. Check 2 hours after a meal 5 times per week. 200 each 3    glucose blood (CONTOUR NEXT TEST) test strip CHECK BLOOD SUGAR 2x daily 100 each 3   hydrochlorothiazide (MICROZIDE) 12.5 MG capsule TAKE 1 CAPSULE BY MOUTH DAILY AT DINNER. 90 capsule 1   Insulin Disposable Pump (V-GO 20) 20 UNIT/24HR KIT 20 Units by Continuous infusion (non-IV) route See admin instructions. 1 kit 2   Insulin Lispro-aabc (LYUMJEV) 100 UNIT/ML SOLN USE MAX 65 UNITS WITH V-GO INSULIN PUMP DAILY 60 mL 1   meclizine (ANTIVERT) 25 MG tablet Take 25 mg by mouth every 8 (eight) hours as needed for dizziness. (Patient not taking: Reported on 05/25/2023)     metFORMIN (GLUCOPHAGE) 500 MG tablet TAKE 1 TABLET BY MOUTH AT BEDTIME APPOINTMENT  REQUIRED  FOR  FUTURE  REFILLS 90 tablet 2   omeprazole (PRILOSEC) 40 MG capsule Take 40 mg by mouth daily.     ONETOUCH DELICA LANCETS 33G MISC Use to check blood sugar 3 times per day. 100 each 2   rosuvastatin (CRESTOR) 5 MG tablet Take 1 tablet (5 mg total) by mouth daily. 90 tablet 1   terazosin (HYTRIN) 2 MG capsule Take 2 mg by mouth at bedtime.     No current facility-administered medications for this visit.    PHYSICAL EXAM: Vitals:   10/04/23 0847 10/04/23 0850  BP: (!) 210/90 (!) 190/90   There is no height or weight on file to calculate BMI.  Wt Readings from Last 3 Encounters:  05/25/23 235 lb (106.6 kg)  02/21/23 245 lb (111.1 kg)  11/16/22 247 lb (112 kg)    General: Well developed, well nourished male in no apparent distress.  HEENT: AT/[place], no external lesions.  Eyes: Conjunctiva clear and no icterus. Neck: Neck supple  Lungs: Respirations not labored Neurologic: Alert, oriented, normal speech Extremities / Skin: Dry.  Psychiatric: Does not appear depressed or anxious  Diabetic Foot Exam - Simple   No data filed     LABS Reviewed Lab Results  Component Value Date   HGBA1C 6.8 (A) 10/04/2023   HGBA1C 6.1 (A) 05/25/2023   HGBA1C 5.9 (A) 02/21/2023   No results found for: "FRUCTOSAMINE" Lab Results  Component  Value Date   CHOL 135 05/25/2023   HDL 60.30 05/25/2023   LDLCALC 60 05/25/2023   TRIG 72.0 05/25/2023   CHOLHDL 2 05/25/2023   Lab Results  Component Value Date   MICRALBCREAT 0.5 11/16/2022   MICRALBCREAT 0.5 12/02/2021   Lab Results  Component Value Date   CREATININE 1.41 05/25/2023   Lab Results  Component Value Date   GFR 54.37 (L) 05/25/2023    ASSESSMENT / PLAN  1. Controlled type 2 diabetes mellitus with complication, with long-term current use of insulin (HCC)   2. Hypoglycemia     Diabetes Mellitus type 2, complicated by CKD. - Diabetic status / severity: Control with frequent hypoglycemia.  Lab Results  Component Value Date   HGBA1C 6.8 (A) 10/04/2023    - Hemoglobin A1c goal : <7%  Patient has frequent hypoglycemia, increased frequency of hypoglycemia during work days related with increased physical activity.  Discussed that hypoglycemia can be detrimental acutely, including seizure even life-threatening.  Advised to use 0-1 click from V-Go pump for the meals during work day.  Adjust clicks for meals based on meal size and carbohydrate content.  No more than 2 clicks at a time.  If he will continue to have hypoglycemia during work time, advised to take off the V-Go pump to avoid basal insulin delivery.  Discussed that if he continues to have hypoglycemia we may have to change to different type of pump /closed loop insulin pump or different regimen for diabetes management.  Encouraged to call our clinic in between visits if he continues to have hypoglycemia or any questions regarding blood sugar.  - Medications:   I) continue V-Go pump 20 units basal, advised for 0-2 clicks for meals 3 times a day. II) metformin 500 mg as needed.  He has not been requiring lately.  - Home glucose testing: Freestyle libre 3 and check as needed. - Discussed/ Gave Hypoglycemia treatment plan.  Advised to take 2 to 4 tablets of glucose tablets to correct hypoglycemia and  has to carry glucose tablet all the time.  Glucagon Emergency  Kit sent.  # Consult : not required at this time.   # Annual urine for microalbuminuria/ creatinine ratio, no microalbuminuria currently. Last  Lab Results  Component Value Date   MICRALBCREAT 0.5 11/16/2022    # Foot check nightly.  # Annual dilated diabetic eye exams.   - Diet: Make healthy diabetic food choices - Life style / activity / exercise: Discussed.  2. Blood pressure  -  BP Readings from Last 1 Encounters:  10/04/23 (!) 190/90    - Control is in target.  Patient reports his blood pressure this morning at home before coming to clinic visit was 140/82.  Asked to monitor blood pressure at home and if he continued to have high advised to discuss with primary care provider to adjust antihypertensive medication. - No change in current plans.  3. Lipid status / Hyperlipidemia - Last  Lab Results  Component Value Date   LDLCALC 60 05/25/2023   - Continue rosuvastatin 5 mg daily.  Diagnoses and all orders for this visit:  Controlled type 2 diabetes mellitus with complication, with long-term current use of insulin (HCC) -     POCT glycosylated hemoglobin (Hb A1C)  Hypoglycemia  Other orders -     Glucagon (GVOKE HYPOPEN 1-PACK) 1 MG/0.2ML SOAJ; Inject 1 mg into the skin as needed (low blood sugar with impaired consciousness).    DISPOSITION Follow up in clinic in 3 months suggested.  Lab on same day of the visit.   All questions answered and patient verbalized understanding of the plan.  Christian Jeramey Lanuza, MD [facility] End[facility]in[facility] [address] [place], [address] [place], [place] [address] Phone # [phone]  At least part of this note was generated using voice recognition software. Inadvertent word errors may have occurred, which were not recognized during the proofreading process.

## 2023-10-18 ENCOUNTER — Other Ambulatory Visit: Payer: Self-pay | Admitting: Endocrinology

## 2023-10-18 DIAGNOSIS — E1165 Type 2 diabetes mellitus with hyperglycemia: Secondary | ICD-10-CM

## 2023-12-20 ENCOUNTER — Other Ambulatory Visit: Payer: Self-pay | Admitting: Endocrinology

## 2023-12-20 DIAGNOSIS — E1165 Type 2 diabetes mellitus with hyperglycemia: Secondary | ICD-10-CM

## 2023-12-20 NOTE — Telephone Encounter (Signed)
 Medication refill request complete

## 2023-12-24 ENCOUNTER — Other Ambulatory Visit: Payer: Self-pay

## 2023-12-24 DIAGNOSIS — I10 Essential (primary) hypertension: Secondary | ICD-10-CM

## 2023-12-24 MED ORDER — HYDROCHLOROTHIAZIDE 12.5 MG PO CAPS: ORAL_CAPSULE | ORAL | 1 refills | Status: DC

## 2023-12-24 NOTE — Telephone Encounter (Signed)
 Pt called and lvm requesting hydrochlorothiazide 12.5mg  capsules refill to be sent in to [facility]. Rx refill sent in, I attempted to call the pt, no answer but I lvm letting him know rx was sent in.

## 2024-01-03 ENCOUNTER — Encounter: Payer: Self-pay | Admitting: Endocrinology

## 2024-01-03 ENCOUNTER — Ambulatory Visit (INDEPENDENT_AMBULATORY_CARE_PROVIDER_SITE_OTHER): Payer: No Typology Code available for payment source | Admitting: Endocrinology

## 2024-01-03 VITALS — BP 148/80 | HR 66 | Resp 20 | Ht 73.0 in | Wt 237.4 lb

## 2024-01-03 DIAGNOSIS — Z7984 Long term (current) use of oral hypoglycemic drugs: Secondary | ICD-10-CM

## 2024-01-03 DIAGNOSIS — E1165 Type 2 diabetes mellitus with hyperglycemia: Secondary | ICD-10-CM | POA: Diagnosis not present

## 2024-01-03 DIAGNOSIS — Z794 Long term (current) use of insulin: Secondary | ICD-10-CM

## 2024-01-03 LAB — BASIC METABOLIC PANEL WITH GFR
BUN: 22 mg/dL (ref 7–25)
CO2: 29 mmol/L (ref 20–32)
Calcium: 9.4 mg/dL (ref 8.6–10.3)
Chloride: 102 mmol/L (ref 98–110)
Creat: 1.3 mg/dL (ref 0.70–1.35)
Glucose, Bld: 132 mg/dL — ABNORMAL HIGH (ref 65–99)
Potassium: 4.7 mmol/L (ref 3.5–5.3)
Sodium: 138 mmol/L (ref 135–146)
eGFR: 63 mL/min/{1.73_m2} (ref >=60)

## 2024-01-03 LAB — MICROALBUMIN / CREATININE URINE RATIO
Creatinine, Urine: 128 mg/dL (ref 20–320)
Microalb Creat Ratio: 2 mg/g{creat} (ref <30)
Microalb, Ur: 0.2 mg/dL

## 2024-01-03 LAB — POCT GLYCOSYLATED HEMOGLOBIN (HGB A1C): Hemoglobin A1C: 7.2 % — AB (ref 4.0–5.6)

## 2024-01-03 NOTE — Progress Notes (Signed)
 Outpatient Endocrinology Note [place] Flynt Breeze, MD  01/03/24  Patient's Name: Christian Yang    DOB: Dec 21, 1960    MRN: [id number]                                                    REASON OF VISIT: Follow up for type 2 diabetes mellitus  PCP: [name], MD  HISTORY OF PRESENT ILLNESS:   Christian Yang is a 63 y.o. old male with past medical history listed below, is here for follow up for type 2 diabetes mellitus.   Pertinent Diabetes History: Patient was previously seen by Dr. [name] and was last time seen in August 2024.  Patient was diagnosed with type 2 diabetes mellitus in 2008.  He has been on V-Go pump since July 2016.  Chronic Diabetes Complications : Retinopathy: no. Last ophthalmology exam was done on annually, following with ophthalmology regularly.  Nephropathy: CKD IIIa Peripheral neuropathy: no Coronary artery disease: no Stroke: no  Relevant comorbidities and cardiovascular risk factors: Obesity: no Body mass index is 31.32 kg/m.  Hypertension: Yes  Hyperlipidemia : Yes, on statin   Current / Home Diabetic regimen includes:  INSULIN regimen is: Insulin pump, V-go 20 basal, mealtime clicks 2-3 at breakfast and 2-3clicks at  lunch and 2-3 at dinner   Non-insulin hypoglycemic drugs the patient is taking are: metformin 0.5g as needed.   Current insulin formulation: Lyumjev  He was advised to switch to OmniPod in the past unwilling due to concern of the cost.  Prior diabetic medications: [place] Pepper, Bydureon in the past around 2017.  He had taken Levemir insulin in the past.  Possible rash from [place].  Glycemic data:    CONTINUOUS GLUCOSE MONITORING SYSTEM (CGMS) INTERPRETATION: At today's visit, we reviewed CGM downloads. The full report is scanned in the media. Reviewing the CGM trends, blood glucose are as follows:  FreeStyle Libre 3 CGM-  Sensor Download (Sensor download was reviewed and summarized below.) Dates: February 28 to January 03, 2024, 14  days Sensor Average: 139 Glucose Management Indicator: 6.6%  % data captured: 96%   Interpretation: Mostly acceptable blood sugar.  Rare mild hypoglycemia with blood sugar in 60s generally in the late afternoon on working days.  Rare hyperglycemia with blood sugar up to 280-300 range happened on March 3 and March 4 around the afternoon and at bedtime.  Overnight blood sugar acceptable.    Hypoglycemia: Patient has rare hypoglycemic episodes. Patient has hypoglycemia awareness.  Factors modifying glucose control: 1.  Diabetic diet assessment: 3 meals a day.  2.  Staying active or exercising: Active at work.  3.  Medication compliance: compliant all of the time.  Interval history  CGM data as reviewed above.  Diabetes regimen reviewed and noted above.  Hemoglobin A1c today 7.2% worsening.  Patient reports he had pneumonia was sick for few days few weeks ago, had hyperglycemia at that time.  GMI on CGM 6.6%.  He has no other complaints today.   REVIEW OF SYSTEMS As per history of present illness.   PAST MEDICAL HISTORY: Past Medical History:  Diagnosis Date   Diabetes mellitus without complication (HCC)    GERD (gastroesophageal reflux disease)    High cholesterol    History of kidney stones     PAST SURGICAL HISTORY: Past Surgical History:  Procedure Laterality Date  CARPAL TUNNEL RELEASE Bilateral    10 +years ago   ESOPHAGOGASTRODUODENOSCOPY  2000   stretching of esophagus   EXTRACORPOREAL SHOCK WAVE LITHOTRIPSY Right 04/08/2018   Procedure: RIGHT EXTRACORPOREAL SHOCK WAVE LITHOTRIPSY (ESWL);  Surgeon: [name], MD;  Location: [facility];  Service: Urology;  Laterality: Right;    ALLERGIES: Allergies  Allergen Reactions   Penicillins Rash    FAMILY HISTORY:  Family History  Problem Relation Age of Onset   Heart disease Mother    Diabetes Mother    Diabetes Sister    Diabetes Maternal Grandmother     SOCIAL HISTORY: Social History   Socioeconomic  History   Marital status: Married    Spouse name: Not on file   Number of children: Not on file   Years of education: Not on file   Highest education level: Not on file  Occupational History   Not on file  Tobacco Use   Smoking status: Never   Smokeless tobacco: Never  Substance and Sexual Activity   Alcohol use: No    Alcohol/week: 0.0 standard drinks of alcohol   Drug use: Never   Sexual activity: Not on file  Other Topics Concern   Not on file  Social History Narrative   Not on file   Social Drivers of Health   Financial Resource Strain: Not on file  Food Insecurity: Not on file  Transportation Needs: Not on file  Physical Activity: Not on file  Stress: Not on file  Social Connections: Not on file    MEDICATIONS:  Current Outpatient Medications  Medication Sig Dispense Refill   Continuous Blood Gluc Receiver (FREESTYLE LIBRE 3 READER) DEVI 1 each by Does not apply route daily. 1 each 0   Continuous Glucose Sensor (FREESTYLE LIBRE 3 SENSOR) MISC Place 1 sensor on the skin every 14 days. Use to check glucose continuously 6 each 2   Glucagon (GVOKE HYPOPEN 1-PACK) 1 MG/0.2ML SOAJ Inject 1 mg into the skin as needed (low blood sugar with impaired consciousness). 0.4 mL 2   glucose blood (CONTOUR NEXT TEST) test strip 1 each by Other route as needed for other. Check blood sugar before all meals and at HS. Check 2 hours after a meal 5 times per week. 200 each 3   glucose blood (CONTOUR NEXT TEST) test strip CHECK BLOOD SUGAR 2x daily 100 each 3   hydrochlorothiazide (MICROZIDE) 12.5 MG capsule TAKE 1 CAPSULE BY MOUTH DAILY AT DINNER. 90 capsule 1   Insulin Disposable Pump (V-GO 20) 20 UNIT/24HR KIT 20 UNITS BY CONTINUOUS INFUSION NON IV ROUTE 1 kit 5   LYUMJEV 100 UNIT/ML SOLN USE MAX 65 UNITS WITH V-GO INSULIN PUMP DAILY 60 mL 1   meclizine (ANTIVERT) 25 MG tablet Take 25 mg by mouth every 8 (eight) hours as needed for dizziness.     metFORMIN (GLUCOPHAGE) 500 MG tablet TAKE  1 TABLET BY MOUTH AT BEDTIME APPOINTMENT  REQUIRED  FOR  FUTURE  REFILLS 90 tablet 2   omeprazole (PRILOSEC) 40 MG capsule Take 40 mg by mouth daily.     ONETOUCH DELICA LANCETS 33G MISC Use to check blood sugar 3 times per day. 100 each 2   rosuvastatin (CRESTOR) 5 MG tablet Take 1 tablet (5 mg total) by mouth daily. 90 tablet 1   terazosin (HYTRIN) 2 MG capsule Take 2 mg by mouth at bedtime.     No current facility-administered medications for this visit.    PHYSICAL EXAM: Vitals:  01/03/24 0833  BP: (!) 166/80  Pulse: 66  Resp: 20  SpO2: 94%  Weight: 237 lb 6.4 oz (107.7 kg)  Height: 6\' 1"  (1.854 m)    Body mass index is 31.32 kg/m.  Wt Readings from Last 3 Encounters:  01/03/24 237 lb 6.4 oz (107.7 kg)  05/25/23 235 lb (106.6 kg)  02/21/23 245 lb (111.1 kg)    General: Well developed, well nourished male in no apparent distress.  HEENT: AT/[place], no external lesions.  Eyes: Conjunctiva clear and no icterus. Neck: Neck supple  Lungs: Respirations not labored Neurologic: Alert, oriented, normal speech Extremities / Skin: Dry.  Psychiatric: Does not appear depressed or anxious  Diabetic Foot Exam - Simple   No data filed     LABS Reviewed Lab Results  Component Value Date   HGBA1C 7.2 (A) 01/03/2024   HGBA1C 6.8 (A) 10/04/2023   HGBA1C 6.1 (A) 05/25/2023   No results found for: "FRUCTOSAMINE" Lab Results  Component Value Date   CHOL 135 05/25/2023   HDL 60.30 05/25/2023   LDLCALC 60 05/25/2023   TRIG 72.0 05/25/2023   CHOLHDL 2 05/25/2023   Lab Results  Component Value Date   MICRALBCREAT 0.5 11/16/2022   MICRALBCREAT 0.5 12/02/2021   Lab Results  Component Value Date   CREATININE 1.41 05/25/2023   Lab Results  Component Value Date   GFR 54.37 (L) 05/25/2023    ASSESSMENT / PLAN  1. Uncontrolled type 2 diabetes mellitus with hyperglycemia (HCC)    Diabetes Mellitus type 2, complicated by CKD. - Diabetic status / severity: Uncontrolled.  Lab  Results  Component Value Date   HGBA1C 7.2 (A) 01/03/2024    - Hemoglobin A1c goal : <6.5%  Patient has random hypoglycemia, increased frequency of hypoglycemia during work days related with increased physical activity.  Frequency of hypoglycemia has decreased.  Discussed that hypoglycemia can be detrimental acutely, including seizure even life-threatening.  Advised to use 0-1 click from V-Go pump for the meals during work day.  Adjust clicks for meals based on meal size and carbohydrate content.  No more than 2 clicks at a time.  If he will continue to have hypoglycemia during work time, advised to take off the V-Go pump to avoid basal insulin delivery.  Discussed that if he continues to have hypoglycemia we may have to change to different type of pump /closed loop insulin pump or different regimen for diabetes management.  Encouraged to call our clinic in between visits if he continues to have hypoglycemia or any questions regarding blood sugar.  - Medications:   I) continue V-Go pump 20 units basal, advised for 0-2 clicks for meals 3 times a day. II) metformin 500 mg as needed.  He has not been requiring lately.  During the walk day advised to use less meal time insulin for the lungs and maybe for the breakfast and okay to use usual dose of mealtime insulin for the supper.  - Home glucose testing: Freestyle libre 3 and check as needed. - Discussed/ Gave Hypoglycemia treatment plan.  Advised to take 2 to 4 tablets of glucose tablets to correct hypoglycemia and has to carry glucose tablet all the time.  Glucagon Emergency Kit sent.  # Consult : not required at this time.   # Annual urine for microalbuminuria/ creatinine ratio, no microalbuminuria currently.  Will check today with BMP with EGFR. Last  Lab Results  Component Value Date   MICRALBCREAT 0.5 11/16/2022    # Foot check  nightly.  # Annual dilated diabetic eye exams.   - Diet: Make healthy diabetic food choices -  Life style / activity / exercise: Discussed.  2. Blood pressure  -  BP Readings from Last 1 Encounters:  01/03/24 (!) 166/80    - Control is in target.  Patient asymptomatic.   Asked to monitor blood pressure at home and if he continued to have high advised to discuss with primary care provider to adjust antihypertensive medication. - No change in current plans.  3. Lipid status / Hyperlipidemia - Last  Lab Results  Component Value Date   LDLCALC 60 05/25/2023   - Continue rosuvastatin 5 mg daily.  Diagnoses and all orders for this visit:  Uncontrolled type 2 diabetes mellitus with hyperglycemia (HCC) -     POCT glycosylated hemoglobin (Hb A1C) -     Microalbumin / creatinine urine ratio -     BASIC METABOLIC PANEL WITH GFR     DISPOSITION Follow up in clinic in 3 months suggested.  Lab on same day of the visit.   All questions answered and patient verbalized understanding of the plan.  [place] Fifi Schindler, MD [facility] Endocrinology [facility] [address] [place], [address] [place], [place] [address] Phone # [phone]  At least part of this note was generated using voice recognition software. Inadvertent word errors may have occurred, which were not recognized during the proofreading process.

## 2024-01-13 ENCOUNTER — Other Ambulatory Visit: Payer: Self-pay | Admitting: Endocrinology

## 2024-01-13 DIAGNOSIS — E7800 Pure hypercholesterolemia, unspecified: Secondary | ICD-10-CM

## 2024-03-20 ENCOUNTER — Other Ambulatory Visit: Payer: Self-pay | Admitting: Endocrinology

## 2024-03-20 DIAGNOSIS — E1165 Type 2 diabetes mellitus with hyperglycemia: Secondary | ICD-10-CM

## 2024-03-21 ENCOUNTER — Other Ambulatory Visit: Payer: Self-pay | Admitting: Endocrinology

## 2024-03-21 DIAGNOSIS — E1165 Type 2 diabetes mellitus with hyperglycemia: Secondary | ICD-10-CM

## 2024-03-21 NOTE — Telephone Encounter (Signed)
 Refill request complete

## 2024-04-01 ENCOUNTER — Encounter: Payer: Self-pay | Admitting: Endocrinology

## 2024-04-01 ENCOUNTER — Ambulatory Visit (INDEPENDENT_AMBULATORY_CARE_PROVIDER_SITE_OTHER): Admitting: Endocrinology

## 2024-04-01 ENCOUNTER — Ambulatory Visit: Payer: Self-pay | Admitting: Endocrinology

## 2024-04-01 VITALS — BP 176/80 | HR 76 | Resp 20 | Ht 73.0 in | Wt 237.6 lb

## 2024-04-01 DIAGNOSIS — E1165 Type 2 diabetes mellitus with hyperglycemia: Secondary | ICD-10-CM | POA: Diagnosis not present

## 2024-04-01 DIAGNOSIS — Z7984 Long term (current) use of oral hypoglycemic drugs: Secondary | ICD-10-CM | POA: Diagnosis not present

## 2024-04-01 LAB — POCT GLYCOSYLATED HEMOGLOBIN (HGB A1C): Hemoglobin A1C: 6.5 % — AB (ref 4.0–5.6)

## 2024-04-01 NOTE — Progress Notes (Signed)
 Outpatient Endocrinology Note Christian Shemeca Lukasik, MD  04/01/24  Patient's Name: Christian Yang    DOB: Nov 19, 1960    MRN: [id number]                                                    REASON OF VISIT: Follow up for type 2 diabetes mellitus  PCP: Wyn Heater, MD  HISTORY OF PRESENT ILLNESS:   Christian Yang is a 63 y.o. old male with past medical history listed below, is here for follow up for type 2 diabetes mellitus.   Pertinent Diabetes History: Patient was previously seen by Dr. Hubert Madden and was last time seen in August 2024.  Patient was diagnosed with type 2 diabetes mellitus in 2008.  He has been on V-Go pump since July 2016.  Chronic Diabetes Complications : Retinopathy: no. Last ophthalmology exam was done on annually, following with ophthalmology regularly.  Nephropathy: CKD IIIa Peripheral neuropathy: no Coronary artery disease: no Stroke: no  Relevant comorbidities and cardiovascular risk factors: Obesity: no Body mass index is 31.35 kg/m.  Hypertension: Yes  Hyperlipidemia : Yes, on statin   Current / Home Diabetic regimen includes:  INSULIN  regimen is: Insulin  pump, V-go 20 basal, mealtime clicks 2-3 at breakfast and 2-3clicks at  lunch and 2-3 at dinner   Non-insulin  hypoglycemic drugs the patient is taking are: metformin  0.5g as needed.   Current insulin  formulation: Lyumjev   He was advised to switch to OmniPod in the past unwilling due to concern of the cost.  Prior diabetic medications: Evone Hoh, Bydureon in the past around 2017.  He had taken Levemir insulin  in the past.  Possible rash from [place].  Glycemic data:    CONTINUOUS GLUCOSE MONITORING SYSTEM (CGMS) INTERPRETATION: At today's visit, we reviewed CGM downloads. The full report is scanned in the media. Reviewing the CGM trends, blood glucose are as follows:  FreeStyle Libre 3 CGM-  Sensor Download (Sensor download was reviewed and summarized below.) Dates: May 28 to June 10 , 2025, 14  days Sensor Average: 140 Glucose Management Indicator: 6.7%  % data captured: 96%     Interpretation: Mostly acceptable blood sugars with rare and occasional hypoglycemia with blood sugar in 60s in between the meals and after meals for correcting/taking insulin  for meals after eating.  No concerning hyperglycemia.  Hypoglycemia: Patient has rare hypoglycemic episodes. Patient has hypoglycemia awareness.  Factors modifying glucose control: 1.  Diabetic diet assessment: 3 meals a day.  2.  Staying active or exercising: Active at work.  3.  Medication compliance: compliant all of the time.  Interval history  CGM data as reviewed above.  Diabetes regimen as reviewed above.  Patient reports he was on prednisone until last week used for poison oak reaction into the skin.  Occasional pain in the feet otherwise denies numbness and tingling, no vision problem.  No other complaints today.  Hemoglobin A1c 6.5% today.  REVIEW OF SYSTEMS As per history of present illness.   PAST MEDICAL HISTORY: Past Medical History:  Diagnosis Date   Diabetes mellitus without complication (HCC)    GERD (gastroesophageal reflux disease)    High cholesterol    History of kidney stones     PAST SURGICAL HISTORY: Past Surgical History:  Procedure Laterality Date   CARPAL TUNNEL RELEASE Bilateral    10 +years ago  ESOPHAGOGASTRODUODENOSCOPY  2000   stretching of esophagus   EXTRACORPOREAL SHOCK WAVE LITHOTRIPSY Right 04/08/2018   Procedure: RIGHT EXTRACORPOREAL SHOCK WAVE LITHOTRIPSY (ESWL);  Surgeon: Marco Severs, MD;  Location: [facility];  Service: Urology;  Laterality: Right;    ALLERGIES: Allergies  Allergen Reactions   Penicillins Rash    FAMILY HISTORY:  Family History  Problem Relation Age of Onset   Heart disease Mother    Diabetes Mother    Diabetes Sister    Diabetes Maternal Grandmother     SOCIAL HISTORY: Social History   Socioeconomic History   Marital status:  Married    Spouse name: Not on file   Number of children: Not on file   Years of education: Not on file   Highest education level: Not on file  Occupational History   Not on file  Tobacco Use   Smoking status: Never   Smokeless tobacco: Never  Substance and Sexual Activity   Alcohol use: No    Alcohol/week: 0.0 standard drinks of alcohol   Drug use: Never   Sexual activity: Not on file  Other Topics Concern   Not on file  Social History Narrative   Not on file   Social Drivers of Health   Financial Resource Strain: Not on file  Food Insecurity: Not on file  Transportation Needs: Not on file  Physical Activity: Not on file  Stress: Not on file  Social Connections: Not on file    MEDICATIONS:  Current Outpatient Medications  Medication Sig Dispense Refill   Continuous Blood Gluc Receiver (FREESTYLE LIBRE 3 READER) DEVI 1 each by Does not apply route daily. 1 each 0   Continuous Glucose Sensor (FREESTYLE LIBRE 3 PLUS SENSOR) MISC PLACE 1 SENSOR ON THE SKIN EVERY 14 DAYS. USE TO CHECK GLUCOSE CONTINUOUSLY 6 each 2   Glucagon  (GVOKE HYPOPEN  1-PACK) 1 MG/0.2ML SOAJ Inject 1 mg into the skin as needed (low blood sugar with impaired consciousness). 0.4 mL 2   glucose blood (CONTOUR NEXT TEST) test strip 1 each by Other route as needed for other. Check blood sugar before all meals and at HS. Check 2 hours after a meal 5 times per week. 200 each 3   glucose blood (CONTOUR NEXT TEST) test strip CHECK BLOOD SUGAR 2x daily 100 each 3   hydrochlorothiazide  (MICROZIDE ) 12.5 MG capsule TAKE 1 CAPSULE BY MOUTH DAILY AT DINNER. 90 capsule 1   Insulin  Disposable Pump (V-GO 20) 20 UNIT/24HR KIT USE 20 UNITS BY CONTINUOUS INFUSION NON IV ROUTE 30 kit 11   LYUMJEV  100 UNIT/ML SOLN USE MAX 65 UNITS WITH V-GO INSULIN  PUMP DAILY 60 mL 1   meclizine (ANTIVERT) 25 MG tablet Take 25 mg by mouth every 8 (eight) hours as needed for dizziness.     metFORMIN  (GLUCOPHAGE ) 500 MG tablet TAKE 1 TABLET BY  MOUTH AT BEDTIME APPOINTMENT  REQUIRED  FOR  FUTURE  REFILLS 90 tablet 2   omeprazole (PRILOSEC) 40 MG capsule Take 40 mg by mouth daily.     ONETOUCH DELICA LANCETS 33G MISC Use to check blood sugar 3 times per day. 100 each 2   rosuvastatin  (CRESTOR ) 5 MG tablet TAKE 1 TABLET (5 MG TOTAL) BY MOUTH DAILY. 90 tablet 1   terazosin (HYTRIN) 2 MG capsule Take 2 mg by mouth at bedtime.     No current facility-administered medications for this visit.    PHYSICAL EXAM: Vitals:   04/01/24 0823  BP: (!) 176/80  Pulse: 76  Resp: 20  SpO2: 96%  Weight: 237 lb 9.6 oz (107.8 kg)  Height: 6\' 1"  (1.854 m)     Body mass index is 31.35 kg/m.  Wt Readings from Last 3 Encounters:  04/01/24 237 lb 9.6 oz (107.8 kg)  01/03/24 237 lb 6.4 oz (107.7 kg)  05/25/23 235 lb (106.6 kg)    General: Well developed, well nourished male in no apparent distress.  HEENT: AT/[place], no external lesions.  Eyes: Conjunctiva clear and no icterus. Neck: Neck supple  Lungs: Respirations not labored Neurologic: Alert, oriented, normal speech Extremities / Skin: Dry.  Psychiatric: Does not appear depressed or anxious  Diabetic Foot Exam - Simple   Simple Foot Form Diabetic Foot exam was performed with the following findings: Yes 04/01/2024  8:42 AM  Visual Inspection No deformities, no ulcerations, no other skin breakdown bilaterally: Yes Sensation Testing Intact to touch and monofilament testing bilaterally: Yes Pulse Check Posterior Tibialis and Dorsalis pulse intact bilaterally: Yes Comments     LABS Reviewed Lab Results  Component Value Date   HGBA1C 6.5 (A) 04/01/2024   HGBA1C 7.2 (A) 01/03/2024   HGBA1C 6.8 (A) 10/04/2023   No results found for: "FRUCTOSAMINE" Lab Results  Component Value Date   CHOL 135 05/25/2023   HDL 60.30 05/25/2023   LDLCALC 60 05/25/2023   TRIG 72.0 05/25/2023   CHOLHDL 2 05/25/2023   Lab Results  Component Value Date   MICRALBCREAT 2 01/03/2024   MICRALBCREAT 0.5  11/16/2022   Lab Results  Component Value Date   CREATININE 1.30 01/03/2024   Lab Results  Component Value Date   GFR 54.37 (L) 05/25/2023    ASSESSMENT / PLAN  1. Uncontrolled type 2 diabetes mellitus with hyperglycemia (HCC)     Diabetes Mellitus type 2, complicated by CKD. - Diabetic status / severity: fair controlled.  Improving  Lab Results  Component Value Date   HGBA1C 6.5 (A) 04/01/2024    - Hemoglobin A1c goal : <6.5%  He has occasional hypoglycemia with the use of mealtime insulin  when using after eating.  Advised to use no more than 3 clicks for high carbohydrate meal.  Otherwise stay on 0-2 clicks.  He had hypoglycemia when he used clicks up to 5 clicks, which is 10 units of insulin .  V-Go pump.   Encouraged to call our clinic in between visits if he continues to have hypoglycemia or any questions regarding blood sugar.  - Medications:   I) continue V-Go pump 20 units basal, advised for 0-2 clicks for meals 3 times a day. II) metformin  500 mg as needed.  He has not been requiring lately.  During the walk day advised to use less meal time insulin  for the lunch and maybe for the breakfast and okay to use usual dose of mealtime insulin  for the supper.  - Home glucose testing: Freestyle libre 3 and check as needed. - Discussed/ Gave Hypoglycemia treatment plan.  Advised to take 2 to 4 tablets of glucose tablets to correct hypoglycemia and has to carry glucose tablet all the time.  Glucagon  Emergency Kit sent.  # Consult : not required at this time.   # Annual urine for microalbuminuria/ creatinine ratio, no microalbuminuria currently.  Will check today with BMP with EGFR. Last  Lab Results  Component Value Date   MICRALBCREAT 2 01/03/2024    # Foot check nightly.  # Annual dilated diabetic eye exams.   - Diet: Make healthy diabetic food choices - Life style / activity /  exercise: Discussed.  2. Blood pressure  -  BP Readings from Last 1 Encounters:   04/01/24 (!) 176/80    - Control is not in target.  Patient asymptomatic.  He reports he has normal blood pressure at home.  He thinks he is just a clinic visit related.  Asked to monitor blood pressure at home and if he continued to have high advised to discuss with primary care provider to adjust antihypertensive medication. - No change in current plans.  3. Lipid status / Hyperlipidemia - Last  Lab Results  Component Value Date   LDLCALC 60 05/25/2023   - Continue rosuvastatin  5 mg daily.  Diagnoses and all orders for this visit:  Uncontrolled type 2 diabetes mellitus with hyperglycemia (HCC) -     POCT glycosylated hemoglobin (Hb A1C)    DISPOSITION Follow up in clinic in 4 months suggested.  Lab on same day of the visit.   All questions answered and patient verbalized understanding of the plan.  Christian Nancee Brownrigg, MD [facility] Endocrinology [facility] [address] [place], [address] [place], [place] [address] Phone # [phone]  At least part of this note was generated using voice recognition software. Inadvertent word errors may have occurred, which were not recognized during the proofreading process.

## 2024-05-04 ENCOUNTER — Other Ambulatory Visit: Payer: Self-pay | Admitting: Endocrinology

## 2024-05-04 DIAGNOSIS — E1165 Type 2 diabetes mellitus with hyperglycemia: Secondary | ICD-10-CM

## 2024-06-19 ENCOUNTER — Other Ambulatory Visit: Payer: Self-pay | Admitting: Endocrinology

## 2024-06-19 DIAGNOSIS — I10 Essential (primary) hypertension: Secondary | ICD-10-CM

## 2024-07-24 ENCOUNTER — Other Ambulatory Visit: Payer: Self-pay | Admitting: Endocrinology

## 2024-07-24 DIAGNOSIS — E7800 Pure hypercholesterolemia, unspecified: Secondary | ICD-10-CM

## 2024-07-24 NOTE — Telephone Encounter (Signed)
 Refill request complete

## 2024-08-01 ENCOUNTER — Ambulatory Visit: Payer: Self-pay | Admitting: Endocrinology

## 2024-08-01 ENCOUNTER — Ambulatory Visit (INDEPENDENT_AMBULATORY_CARE_PROVIDER_SITE_OTHER): Admitting: Endocrinology

## 2024-08-01 ENCOUNTER — Encounter: Payer: Self-pay | Admitting: Endocrinology

## 2024-08-01 ENCOUNTER — Other Ambulatory Visit

## 2024-08-01 VITALS — BP 156/72 | HR 64 | Resp 20 | Ht 73.0 in | Wt 241.8 lb

## 2024-08-01 DIAGNOSIS — E1165 Type 2 diabetes mellitus with hyperglycemia: Secondary | ICD-10-CM

## 2024-08-01 DIAGNOSIS — Z794 Long term (current) use of insulin: Secondary | ICD-10-CM

## 2024-08-01 DIAGNOSIS — E7800 Pure hypercholesterolemia, unspecified: Secondary | ICD-10-CM

## 2024-08-01 LAB — LIPID PANEL
Cholesterol: 160 mg/dL (ref <200)
HDL: 61 mg/dL (ref >=40)
LDL Cholesterol (Calc): 85 mg/dL
Non-HDL Cholesterol (Calc): 99 mg/dL (ref <130)
Total CHOL/HDL Ratio: 2.6 (calc) (ref <5.0)
Triglycerides: 67 mg/dL (ref <150)

## 2024-08-01 LAB — POCT GLYCOSYLATED HEMOGLOBIN (HGB A1C): Hemoglobin A1C: 6.7 % — AB (ref 4.0–5.6)

## 2024-08-01 LAB — BASIC METABOLIC PANEL WITH GFR
BUN/Creatinine Ratio: 18 (calc) (ref 6–22)
BUN: 26 mg/dL — ABNORMAL HIGH (ref 7–25)
CO2: 28 mmol/L (ref 20–32)
Calcium: 9.3 mg/dL (ref 8.6–10.3)
Chloride: 103 mmol/L (ref 98–110)
Creat: 1.47 mg/dL — ABNORMAL HIGH (ref 0.70–1.35)
Glucose, Bld: 110 mg/dL (ref 65–139)
Potassium: 4.2 mmol/L (ref 3.5–5.3)
Sodium: 138 mmol/L (ref 135–146)
eGFR: 54 mL/min/1.73m2 — ABNORMAL LOW (ref >=60)

## 2024-08-01 MED ORDER — METFORMIN HCL 500 MG PO TABS: ORAL_TABLET | ORAL | 3 refills | Status: AC

## 2024-08-01 MED ORDER — ROSUVASTATIN CALCIUM 5 MG PO TABS: 5.0000 mg | ORAL_TABLET | Freq: Every day | ORAL | 3 refills | Status: AC

## 2024-08-01 NOTE — Progress Notes (Signed)
 Outpatient Endocrinology Note Christian Stephannie Broner, MD  08/01/24  Patient's Name: Christian Yang    DOB: 02-24-1961    MRN: [id number]                                                    REASON OF VISIT: Follow up for type 2 diabetes mellitus  PCP: Nanci Senior, MD  HISTORY OF PRESENT ILLNESS:   Christian Yang is a 63 y.o. old male with past medical history listed below, is here for follow up for type 2 diabetes mellitus.   Pertinent Diabetes History: Patient was previously seen by Dr. Von and was last time seen in August 2024.  Patient was diagnosed with type 2 diabetes mellitus in 2008.  He has been on V-Go pump since July 2016.  Chronic Diabetes Complications : Retinopathy: no. Last ophthalmology exam was done on annually, following with ophthalmology regularly.  Nephropathy: CKD IIIa Peripheral neuropathy: no Coronary artery disease: no Stroke: no  Relevant comorbidities and cardiovascular risk factors: Obesity: no Body mass index is 31.9 kg/m.  Hypertension: Yes  Hyperlipidemia : Yes, on statin   Current / Home Diabetic regimen includes:  INSULIN  regimen is: Insulin  pump, V-go 20 basal, mealtime clicks 2-3 at breakfast and 2-3 clicks at  lunch and 2-3 at dinner   Non-insulin  hypoglycemic drugs the patient is taking are: metformin  0.5g as needed.   Current insulin  formulation: Lyumjev   He was advised to switch to OmniPod in the past unwilling due to concern of the cost.  Prior diabetic medications: Bernadine, Bydureon in the past around 2017.  He had taken Levemir insulin  in the past.  Possible rash from [place].  Glycemic data:    CONTINUOUS GLUCOSE MONITORING SYSTEM (CGMS) INTERPRETATION: At today's visit, we reviewed CGM downloads. The full report is scanned in the media. Reviewing the CGM trends, blood glucose are as follows:  FreeStyle Libre 3 CGM-  Sensor Download (Sensor download was reviewed and summarized below.) Dates: September 26 to October 9, 14  days Sensor Average: 134 Glucose Management Indicator: 6.5%  % data captured:100%    Interpretation: Frequent hypoglycemia with blood sugar 50-60 range in between the meals related to use of mealtime insulin .  Reorder mild hyperglycemia with blood sugar up to 250 range postprandially.  Mostly acceptable blood sugar overnight.  Mostly acceptable blood sugars with rare and occasional hypoglycemia with blood sugar in 60s in between the meals and after meals for correcting/taking insulin  for meals after eating.  No concerning hyperglycemia.  Hypoglycemia: Patient has rare hypoglycemic episodes. Patient has hypoglycemia awareness.  Factors modifying glucose control: 1.  Diabetic diet assessment: 3 meals a day.  2.  Staying active or exercising: Active at work.  3.  Medication compliance: compliant all of the time.  Interval history  CGM data as reviewed above.  Occasional hypoglycemia after meals related to mealtime insulin .  Hemoglobin A1c 6.7%.  No other complaints today.  REVIEW OF SYSTEMS As per history of present illness.   PAST MEDICAL HISTORY: Past Medical History:  Diagnosis Date   Diabetes mellitus without complication (HCC)    GERD (gastroesophageal reflux disease)    High cholesterol    History of kidney stones     PAST SURGICAL HISTORY: Past Surgical History:  Procedure Laterality Date   CARPAL TUNNEL RELEASE Bilateral    10 +  years ago   ESOPHAGOGASTRODUODENOSCOPY  2000   stretching of esophagus   EXTRACORPOREAL SHOCK WAVE LITHOTRIPSY Right 04/08/2018   Procedure: RIGHT EXTRACORPOREAL SHOCK WAVE LITHOTRIPSY (ESWL);  Surgeon: Sherrilee Belvie CROME, MD;  Location: [facility];  Service: Urology;  Laterality: Right;    ALLERGIES: Allergies  Allergen Reactions   Penicillins Rash    FAMILY HISTORY:  Family History  Problem Relation Age of Onset   Heart disease Mother    Diabetes Mother    Diabetes Sister    Diabetes Maternal Grandmother     SOCIAL  HISTORY: Social History   Socioeconomic History   Marital status: Married    Spouse name: Not on file   Number of children: Not on file   Years of education: Not on file   Highest education level: Not on file  Occupational History   Not on file  Tobacco Use   Smoking status: Never   Smokeless tobacco: Never  Substance and Sexual Activity   Alcohol use: No    Alcohol/week: 0.0 standard drinks of alcohol   Drug use: Never   Sexual activity: Not on file  Other Topics Concern   Not on file  Social History Narrative   Not on file   Social Drivers of Health   Financial Resource Strain: Not on file  Food Insecurity: Not on file  Transportation Needs: Not on file  Physical Activity: Not on file  Stress: Not on file  Social Connections: Not on file    MEDICATIONS:  Current Outpatient Medications  Medication Sig Dispense Refill   Continuous Blood Gluc Receiver (FREESTYLE LIBRE 3 READER) DEVI 1 each by Does not apply route daily. 1 each 0   Continuous Glucose Sensor (FREESTYLE LIBRE 3 PLUS SENSOR) MISC PLACE 1 SENSOR ON THE SKIN EVERY 14 DAYS. USE TO CHECK GLUCOSE CONTINUOUSLY 6 each 2   Glucagon  (GVOKE HYPOPEN  1-PACK) 1 MG/0.2ML SOAJ Inject 1 mg into the skin as needed (low blood sugar with impaired consciousness). 0.4 mL 2   glucose blood (CONTOUR NEXT TEST) test strip 1 each by Other route as needed for other. Check blood sugar before all meals and at HS. Check 2 hours after a meal 5 times per week. 200 each 3   glucose blood (CONTOUR NEXT TEST) test strip CHECK BLOOD SUGAR 2x daily 100 each 3   hydrochlorothiazide  (MICROZIDE ) 12.5 MG capsule TAKE 1 CAPSULE BY MOUTH DAILY AT DINNER. 90 capsule 3   Insulin  Disposable Pump (V-GO 20) 20 UNIT/24HR KIT USE 20 UNITS BY CONTINUOUS INFUSION NON IV ROUTE 30 kit 11   LYUMJEV  100 UNIT/ML SOLN USE MAX 65 UNITS WITH V-GO INSULIN  PUMP DAILY 60 mL 1   omeprazole (PRILOSEC) 40 MG capsule Take 40 mg by mouth daily.     ONETOUCH DELICA LANCETS  33G MISC Use to check blood sugar 3 times per day. 100 each 2   terazosin (HYTRIN) 2 MG capsule Take 2 mg by mouth at bedtime.     metFORMIN  (GLUCOPHAGE ) 500 MG tablet TAKE 1 TABLET BY MOUTH AT BEDTIME . APPOINTMENT REQUIRED FOR FUTURE REFILLS 90 tablet 3   rosuvastatin  (CRESTOR ) 5 MG tablet Take 1 tablet (5 mg total) by mouth daily. 90 tablet 3   No current facility-administered medications for this visit.    PHYSICAL EXAM: Vitals:   08/01/24 0831 08/01/24 0832  BP: (!) 162/82 (!) 156/72  Pulse: 64   Resp: 20   SpO2: 95%   Weight: 241 lb 12.8 oz (109.7 kg)  Height: 6' 1 (1.854 m)       Body mass index is 31.9 kg/m.  Wt Readings from Last 3 Encounters:  08/01/24 241 lb 12.8 oz (109.7 kg)  04/01/24 237 lb 9.6 oz (107.8 kg)  01/03/24 237 lb 6.4 oz (107.7 kg)    General: Well developed, well nourished male in no apparent distress.  HEENT: AT/, no external lesions.  Eyes: Conjunctiva clear and no icterus. Neck: Neck supple  Lungs: Respirations not labored Neurologic: Alert, oriented, normal speech Extremities / Skin: Dry.  Psychiatric: Does not appear depressed or anxious  Diabetic Foot Exam - Simple   No data filed     LABS Reviewed Lab Results  Component Value Date   HGBA1C 6.7 (A) 08/01/2024   HGBA1C 6.5 (A) 04/01/2024   HGBA1C 7.2 (A) 01/03/2024   No results found for: FRUCTOSAMINE Lab Results  Component Value Date   CHOL 135 05/25/2023   HDL 60.30 05/25/2023   LDLCALC 60 05/25/2023   TRIG 72.0 05/25/2023   CHOLHDL 2 05/25/2023   Lab Results  Component Value Date   MICRALBCREAT 2 01/03/2024   MICRALBCREAT 6 05/14/2020   Lab Results  Component Value Date   CREATININE 1.30 01/03/2024   Lab Results  Component Value Date   GFR 54.37 (L) 05/25/2023    ASSESSMENT / PLAN  1. Uncontrolled type 2 diabetes mellitus with hyperglycemia, with long-term current use of insulin  (HCC)   2. Pure hypercholesterolemia     Diabetes Mellitus type 2,  complicated by CKD. - Diabetic status / severity: fair controlled.  Improving  Lab Results  Component Value Date   HGBA1C 6.7 (A) 08/01/2024    - Hemoglobin A1c goal : <6.5%  He has occasional hypoglycemia with the use of mealtime insulin  when using after eating.  Advised to use no more than 3 clicks for high carbohydrate meal.  Otherwise stay on 0-2 clicks.  He had hypoglycemia when he used clicks up to 5 clicks, which is 10 units of insulin .  V-Go pump.   Encouraged to call our clinic in between visits if he continues to have hypoglycemia or any questions regarding blood sugar.  - Medications:   I) continue V-Go pump 20 units basal, advised for 0-2 clicks for meals 3 times a day. II) metformin  500 mg as needed.  He has not been requiring lately.  Discussed to adjust mealtime insulin  based on his physical activity for example walking.  - Home glucose testing: Freestyle libre 3 + and check as needed.  - Discussed/ Gave Hypoglycemia treatment plan.  Advised to take 2 to 4 tablets of glucose tablets to correct hypoglycemia and has to carry glucose tablet all the time.  Glucagon  Emergency Kit sent.  # Consult : not required at this time.   # Annual urine for microalbuminuria/ creatinine ratio, no microalbuminuria currently.   Last  Lab Results  Component Value Date   MICRALBCREAT 2 01/03/2024    # Foot check nightly.  # Annual dilated diabetic eye exams.   - Diet: Make healthy diabetic food choices - Life style / activity / exercise: Discussed.  2. Blood pressure  -  BP Readings from Last 1 Encounters:  08/01/24 (!) 156/72    - Control is not in target.  Patient asymptomatic.  He reports he has normal blood pressure at home.  He thinks he is just a clinic visit related.  Asked to monitor blood pressure at home and if he continued to have high advised to  discuss with primary care provider to adjust antihypertensive medication. - No change in current plans.  3. Lipid  status / Hyperlipidemia - Last  Lab Results  Component Value Date   LDLCALC 60 05/25/2023   - Continue rosuvastatin  5 mg daily. -Will check lipid panel today.   Diagnoses and all orders for this visit:  Uncontrolled type 2 diabetes mellitus with hyperglycemia, with long-term current use of insulin  (HCC) -     POCT glycosylated hemoglobin (Hb A1C) -     metFORMIN  (GLUCOPHAGE ) 500 MG tablet; TAKE 1 TABLET BY MOUTH AT BEDTIME . APPOINTMENT REQUIRED FOR FUTURE REFILLS -     Basic metabolic panel with GFR -     Lipid panel  Pure hypercholesterolemia -     Basic metabolic panel with GFR -     Lipid panel -     rosuvastatin  (CRESTOR ) 5 MG tablet; Take 1 tablet (5 mg total) by mouth daily.     DISPOSITION Follow up in clinic in 4 months suggested.  Labs today as ordered.    All questions answered and patient verbalized understanding of the plan.  Christian Raniah Karan, MD [facility] Endocrinology [facility] [address] [place], [address] [place], [place] [address] Phone # [phone]  At least part of this note was generated using voice recognition software. Inadvertent word errors may have occurred, which were not recognized during the proofreading process.

## 2024-11-02 ENCOUNTER — Other Ambulatory Visit: Payer: Self-pay | Admitting: Endocrinology

## 2024-11-02 DIAGNOSIS — E1165 Type 2 diabetes mellitus with hyperglycemia: Secondary | ICD-10-CM

## 2024-12-02 ENCOUNTER — Ambulatory Visit: Admitting: Endocrinology
